# Patient Record
Sex: Female | Born: 1980 | Race: Black or African American | Hispanic: No | Marital: Married | Smoking: Never smoker
Health system: Southern US, Community
[De-identification: ages and names within clinical notes are randomized; demographics above are authoritative.]

## PROBLEM LIST (undated history)

## (undated) ENCOUNTER — Emergency Department (HOSPITAL_COMMUNITY): Admission: EM | Payer: 59

## (undated) DIAGNOSIS — Z973 Presence of spectacles and contact lenses: Secondary | ICD-10-CM

## (undated) DIAGNOSIS — Z9289 Personal history of other medical treatment: Secondary | ICD-10-CM

## (undated) DIAGNOSIS — G378 Other specified demyelinating diseases of central nervous system: Secondary | ICD-10-CM

## (undated) DIAGNOSIS — J302 Other seasonal allergic rhinitis: Secondary | ICD-10-CM

## (undated) DIAGNOSIS — Z01419 Encounter for gynecological examination (general) (routine) without abnormal findings: Secondary | ICD-10-CM

## (undated) DIAGNOSIS — L309 Dermatitis, unspecified: Secondary | ICD-10-CM

## (undated) DIAGNOSIS — Z9884 Bariatric surgery status: Secondary | ICD-10-CM

## (undated) DIAGNOSIS — G3781 Myelin oligodendrocyte glycoprotein antibody disease: Secondary | ICD-10-CM

## (undated) DIAGNOSIS — F329 Major depressive disorder, single episode, unspecified: Secondary | ICD-10-CM

## (undated) HISTORY — DX: Encounter for gynecological examination (general) (routine) without abnormal findings: Z01.419

## (undated) HISTORY — DX: Personal history of other medical treatment: Z92.89

## (undated) HISTORY — DX: Bariatric surgery status: Z98.84

## (undated) HISTORY — DX: Other seasonal allergic rhinitis: J30.2

## (undated) HISTORY — DX: Dermatitis, unspecified: L30.9

## (undated) HISTORY — DX: Major depressive disorder, single episode, unspecified: F32.9

## (undated) HISTORY — DX: Presence of spectacles and contact lenses: Z97.3

---

## 2006-03-15 DIAGNOSIS — Z9289 Personal history of other medical treatment: Secondary | ICD-10-CM

## 2006-03-15 HISTORY — DX: Personal history of other medical treatment: Z92.89

## 2007-02-16 ENCOUNTER — Other Ambulatory Visit: Admission: RE | Admit: 2007-02-16 | Discharge: 2007-02-16 | Payer: Self-pay | Admitting: Obstetrics & Gynecology

## 2008-02-24 ENCOUNTER — Inpatient Hospital Stay (HOSPITAL_COMMUNITY): Admission: AD | Admit: 2008-02-24 | Discharge: 2008-02-25 | Payer: Self-pay | Admitting: Obstetrics and Gynecology

## 2008-02-24 IMAGING — US US OB LIMITED
1 series · 14 of 14 positions shown · non-contrast
Comparison: none

OBSTETRICAL ULTRASOUND:
 This ultrasound exam was performed in the [HOSPITAL] Ultrasound Department.  The OB US report was generated in the AS system, and faxed to the ordering physician.  This report is also available in [REDACTED] PACS.

[Series 1: us ob limited · 0.28mm/px · 14 of 14 slices shown]
[im 1/14]
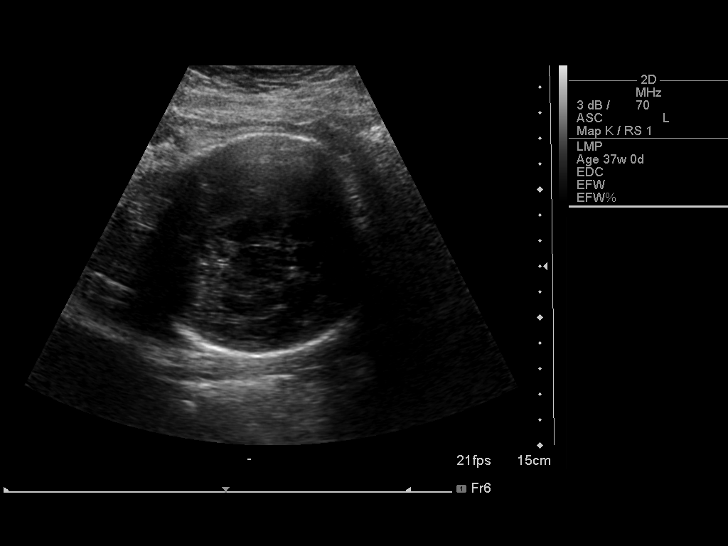
[im 2/14]
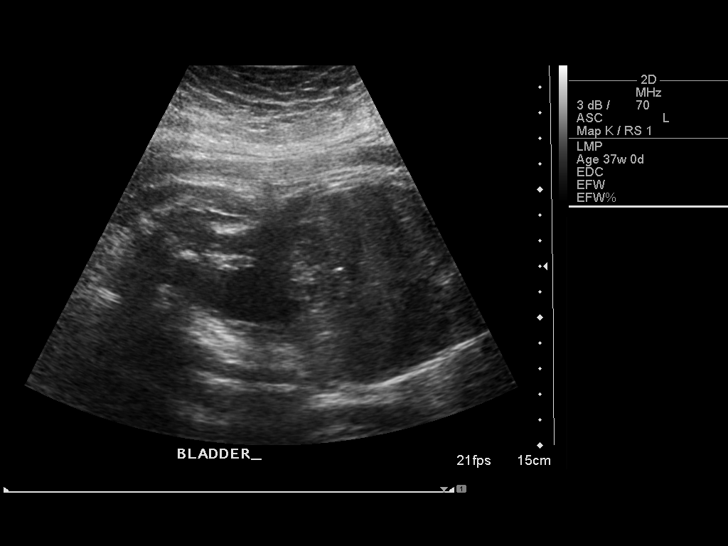
[im 3/14]
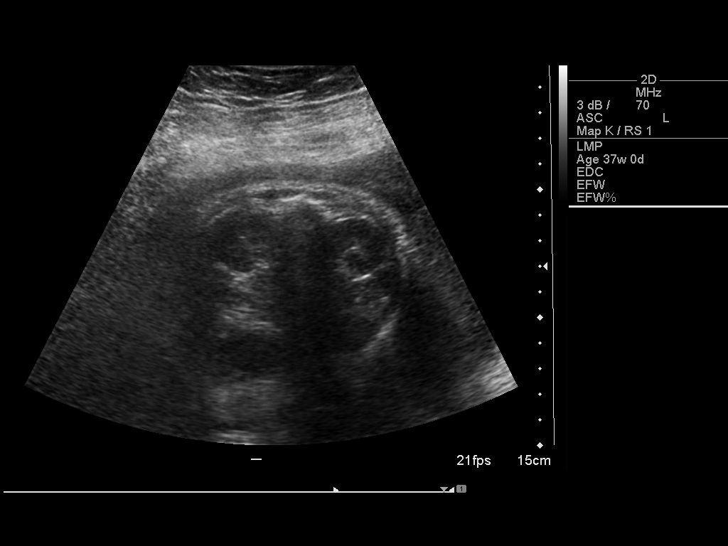
[im 4/14]
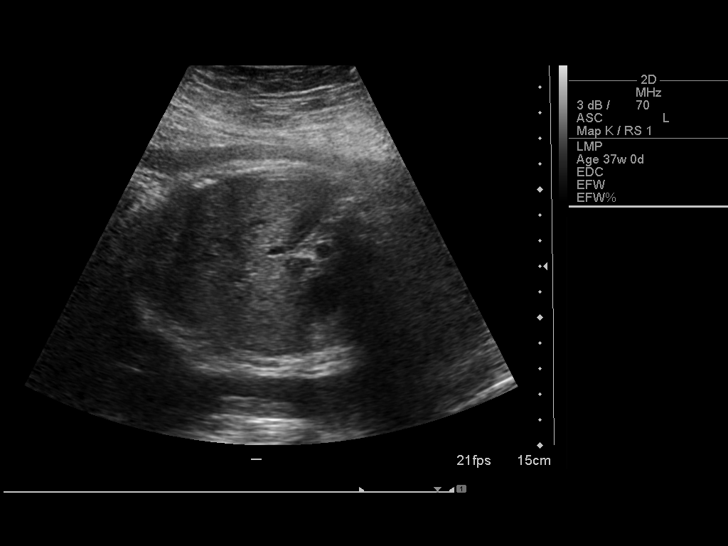
[im 5/14]
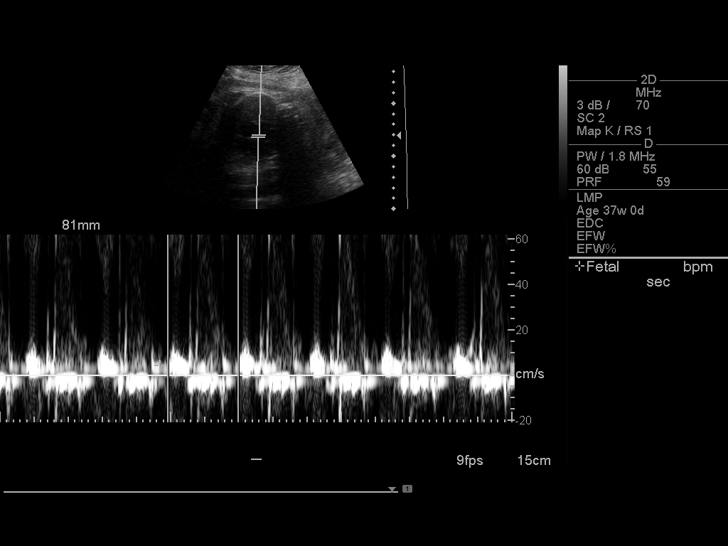
[im 6/14]
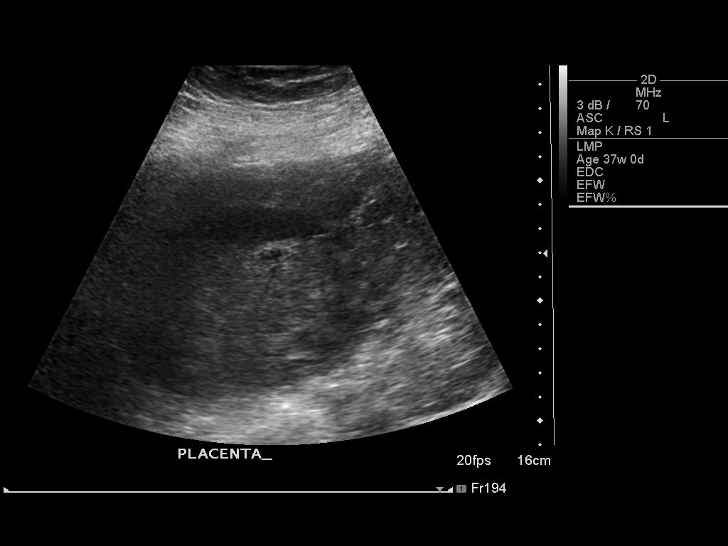
[im 7/14]
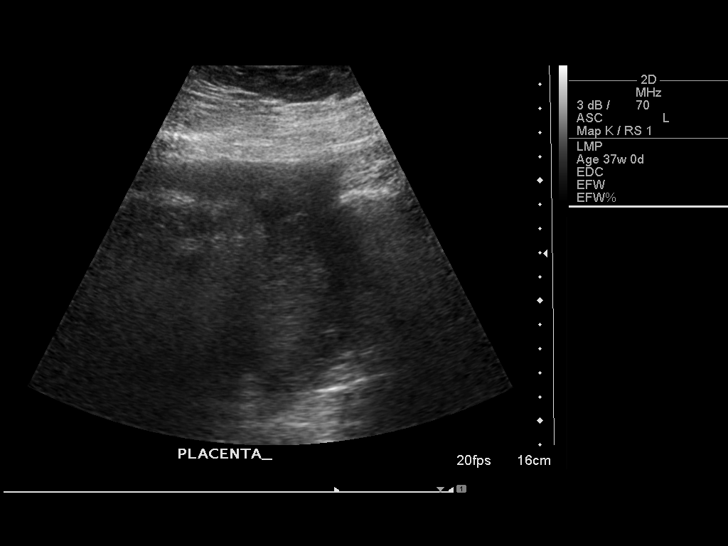
[im 8/14]
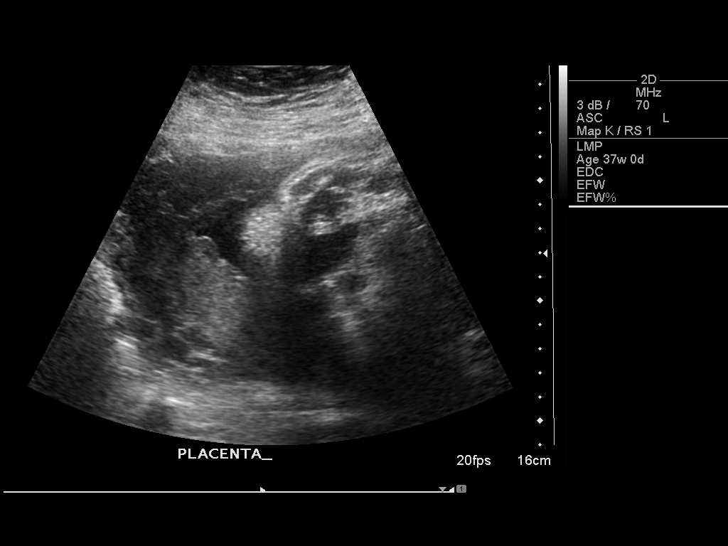
[im 9/14]
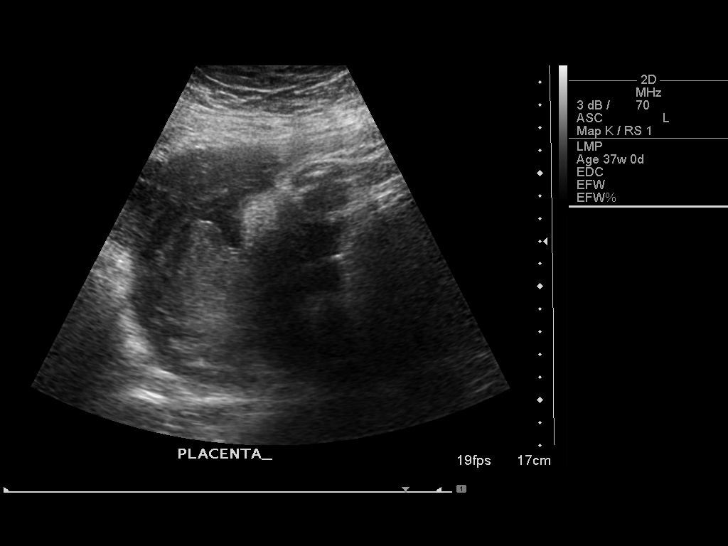
[im 10/14]
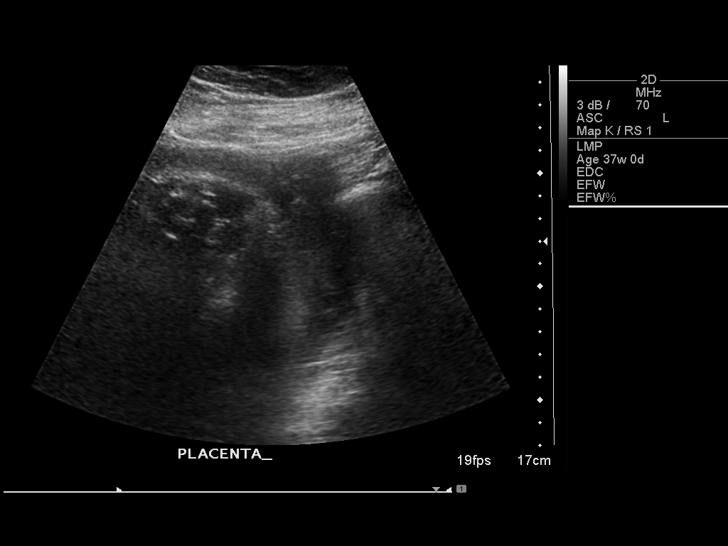
[im 11/14]
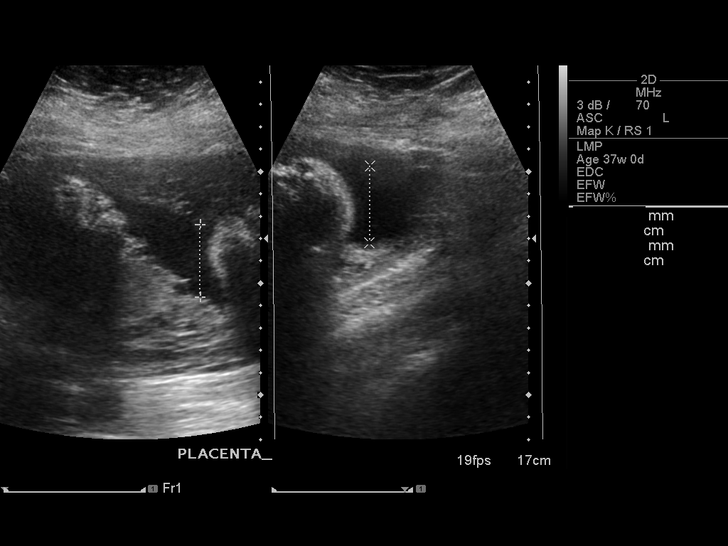
[im 12/14]
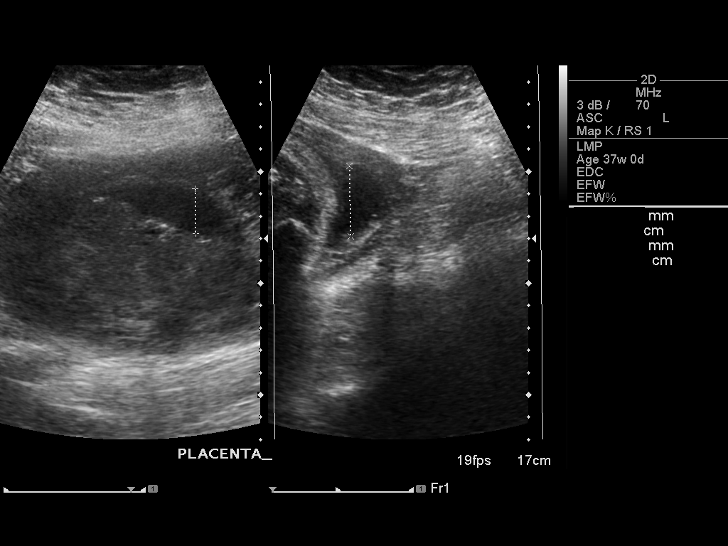
[im 13/14]
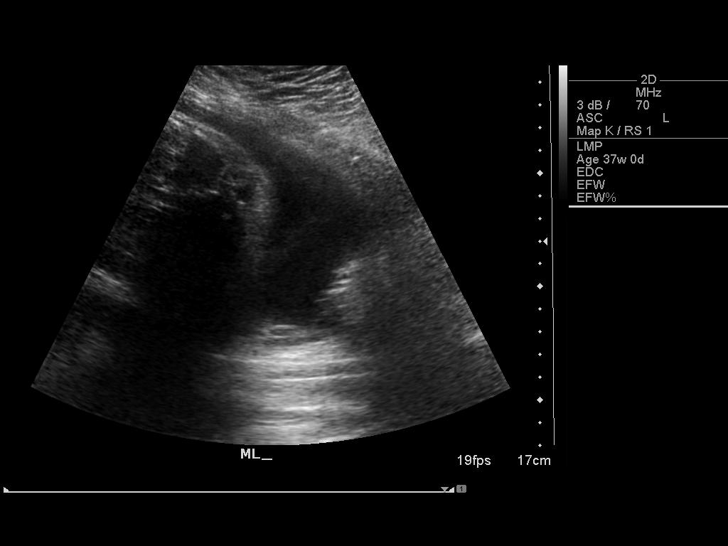
[im 14/14]
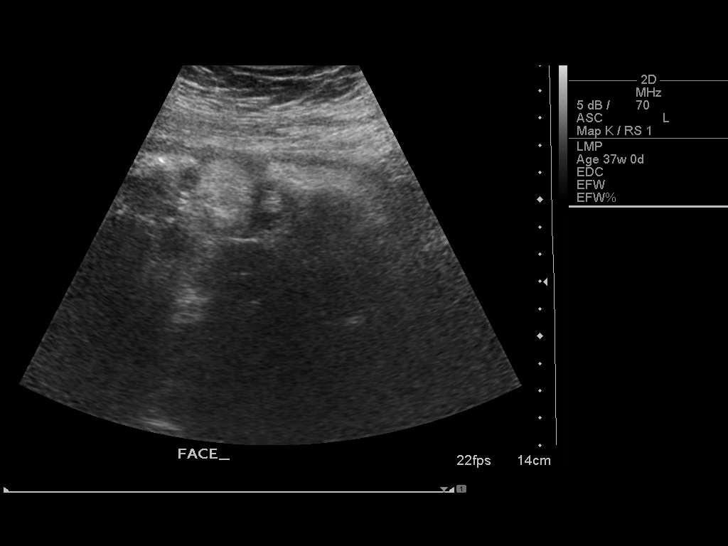

[14 of 14 positions shown; findings below may reference images not displayed]

IMPRESSION: See AS Obstetric US report.

## 2008-02-26 ENCOUNTER — Inpatient Hospital Stay (HOSPITAL_COMMUNITY): Admission: AD | Admit: 2008-02-26 | Discharge: 2008-02-29 | Payer: Self-pay | Admitting: Obstetrics and Gynecology

## 2009-02-20 ENCOUNTER — Ambulatory Visit (HOSPITAL_COMMUNITY): Admission: RE | Admit: 2009-02-20 | Discharge: 2009-02-20 | Payer: Self-pay | Admitting: General Surgery

## 2009-02-20 IMAGING — CR DG CHEST 2V
2 series · 2 of 2 positions shown · non-contrast
Comparison: None

CLINICAL DATA: Bariatric screening evaluation.  Nonsmoker.  No
current chest complaints.

CHEST - 2 VIEW

[view not recorded (1 of 2)]
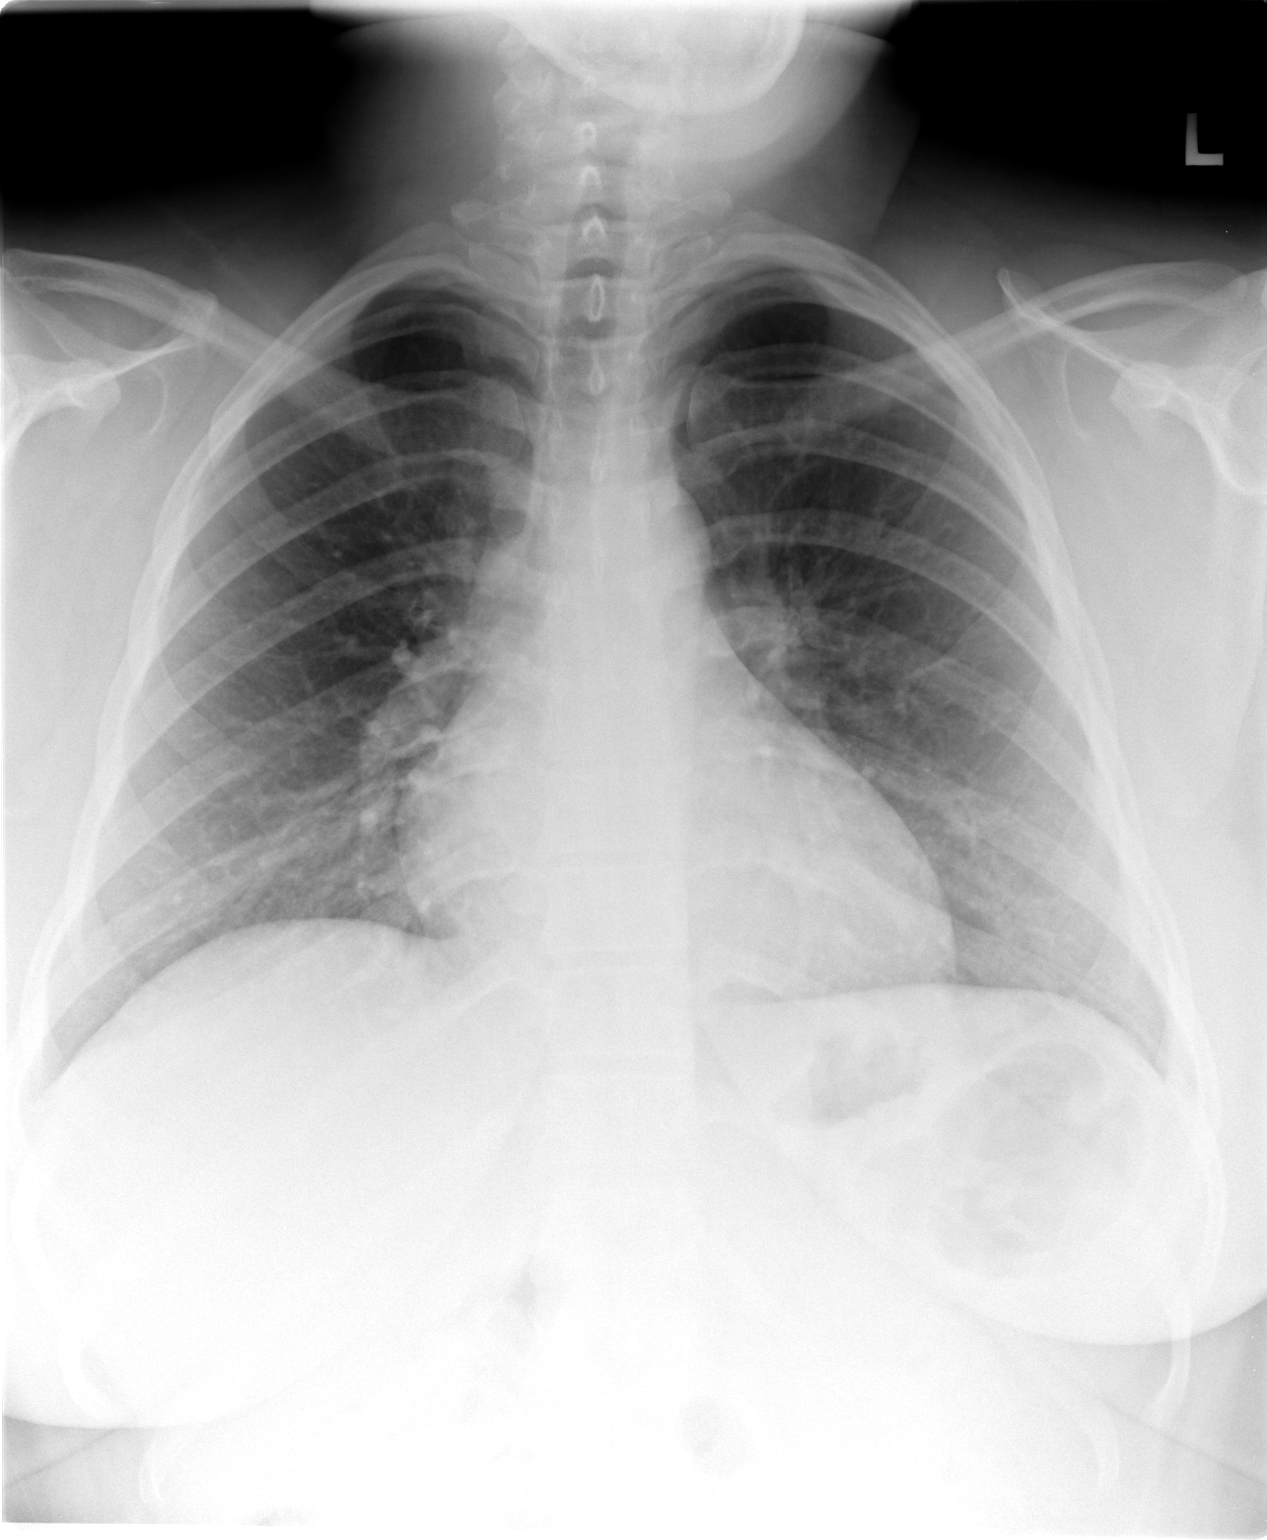

[view not recorded (2 of 2)]
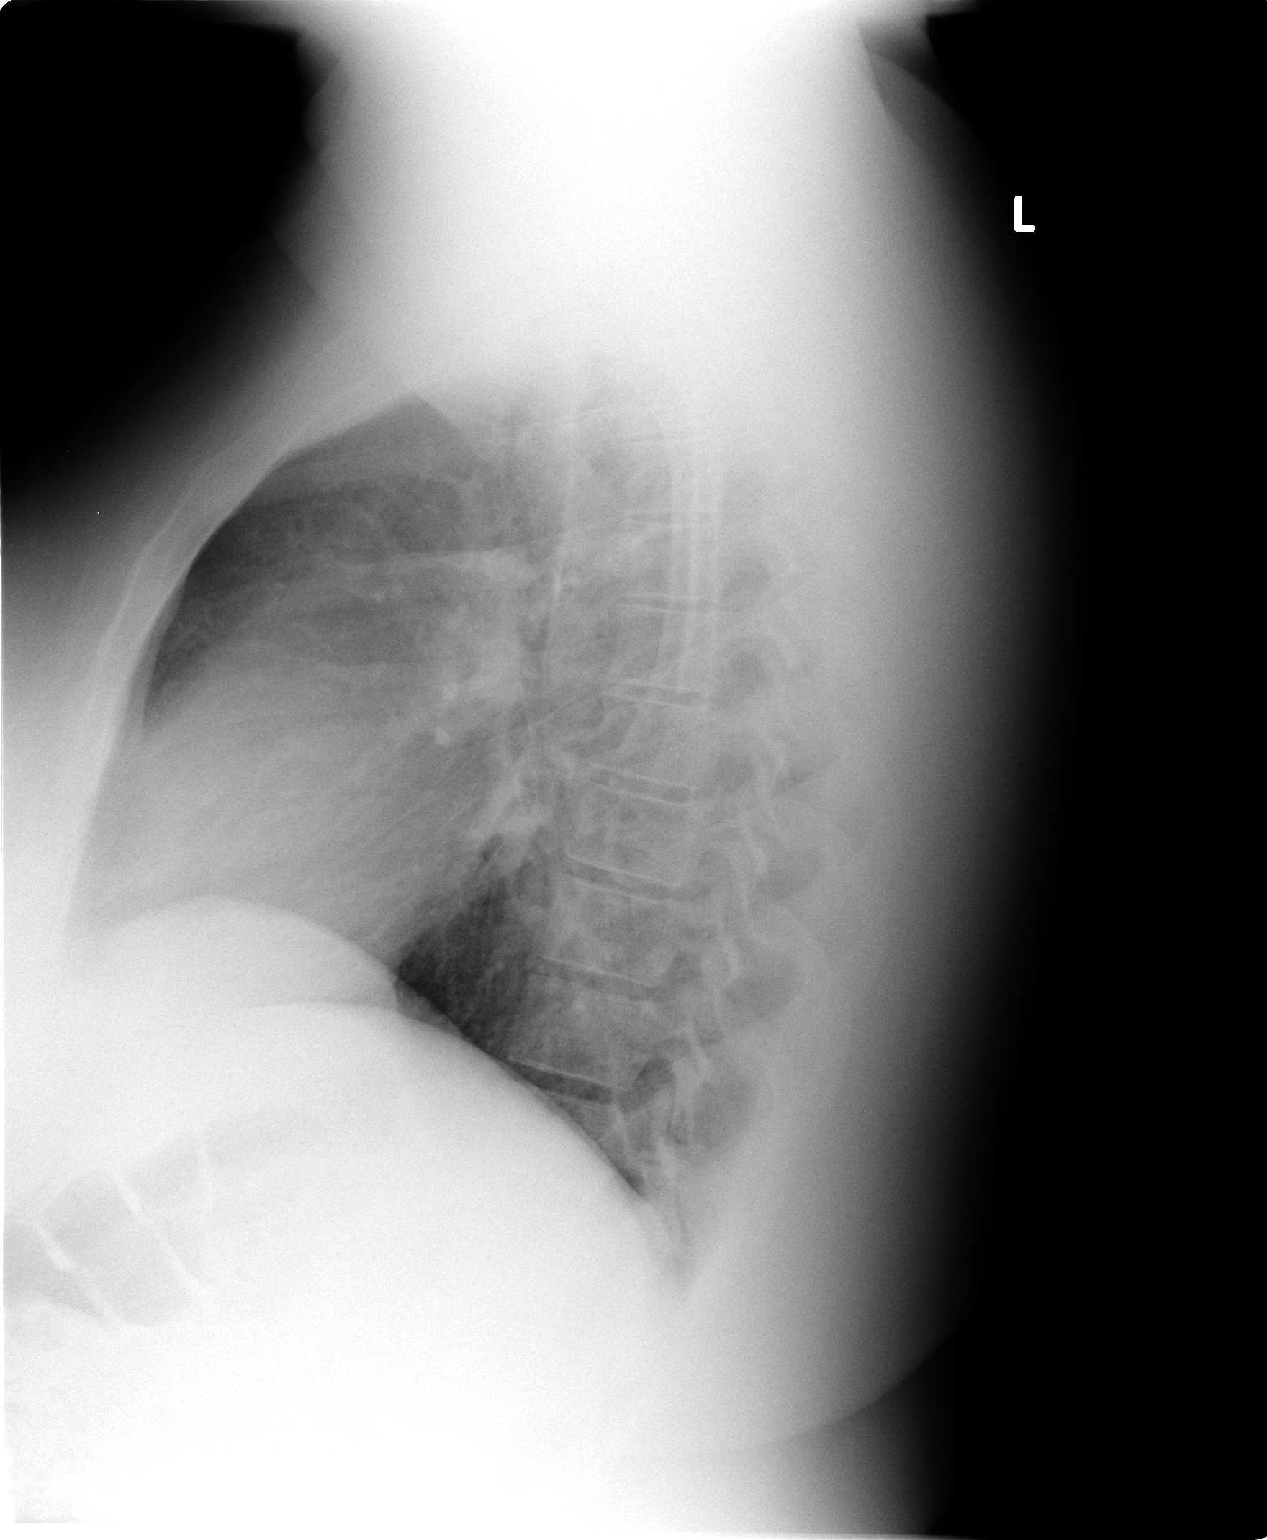

[2 of 2 positions shown; findings below may reference images not displayed]

FINDINGS: Heart and mediastinal contours are within normal limits.
The lung fields are clear with no signs of focal infiltrate or
congestive failure.  No pleural effusions or peribronchial cuffing
are seen.  Bony structures are intact.
IMPRESSION: No acute or focal cardiopulmonary abnormality suggested.

## 2009-02-20 IMAGING — CR DG UGI W/ KUB
7 series · 7 of 7 positions shown · non-contrast
Comparison: None

CLINICAL DATA: Bariatric screening evaluation.  No gastric
complaints

UPPER GI SERIES WITH KUB
TECHNIQUE: Routine upper GI series was performed with thin barium.
Fluoroscopy Time: 2.0 minutes

[view not recorded (1 of 7)]
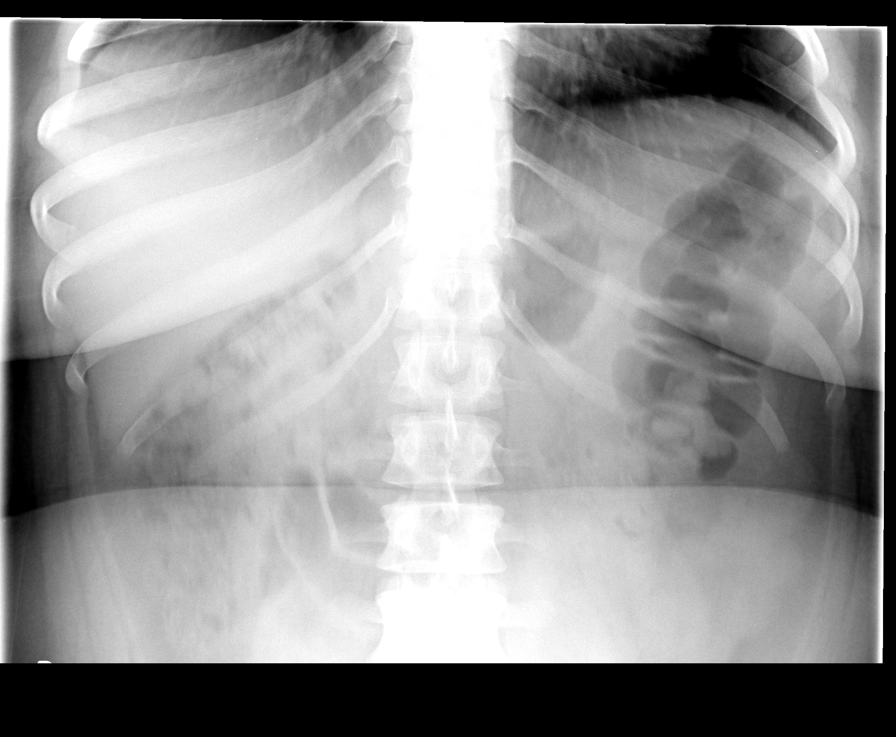

[view not recorded (2 of 7)]
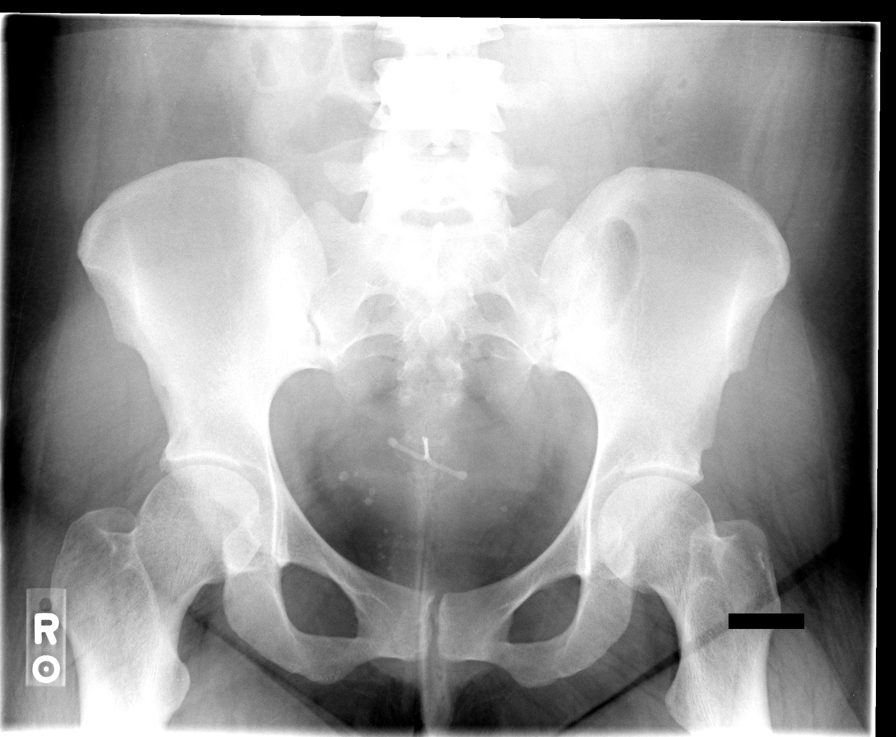

[view not recorded (3 of 7)]
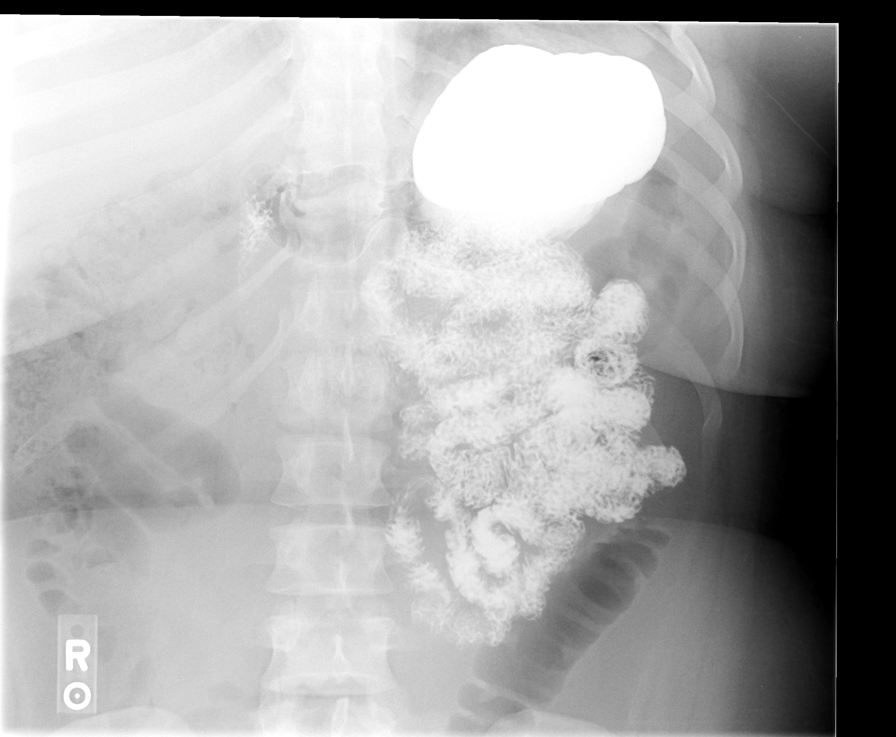

[view not recorded (4 of 7)]
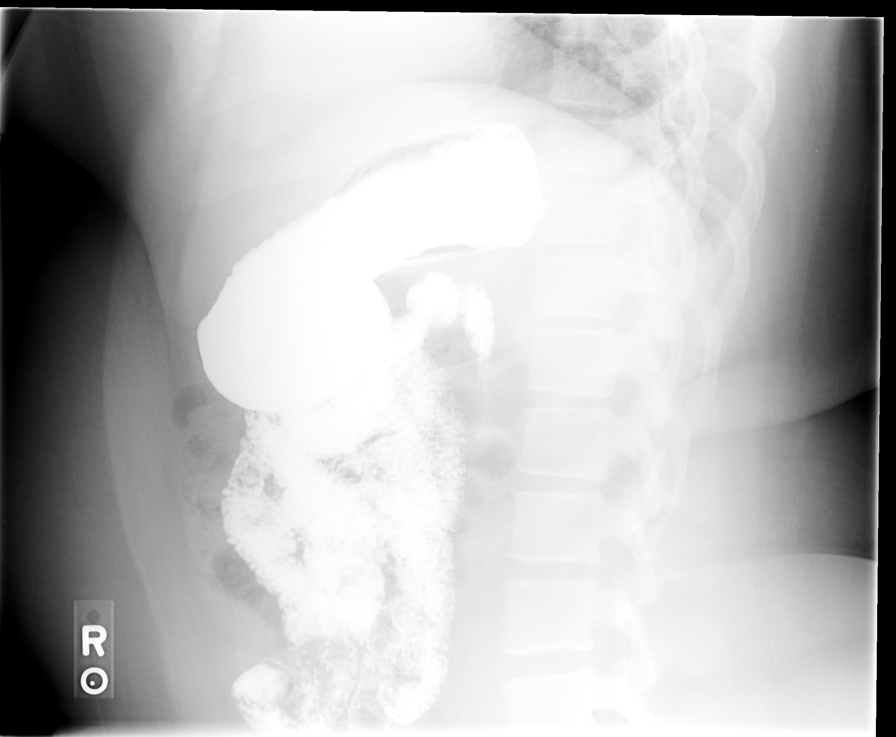

[view not recorded (5 of 7)]
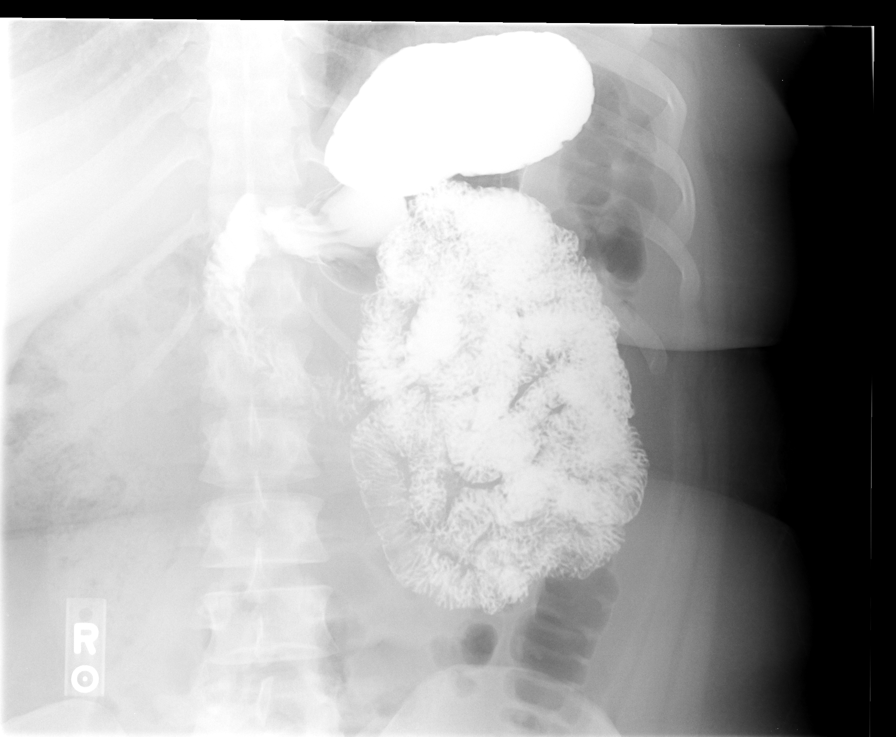

[view not recorded (6 of 7)]
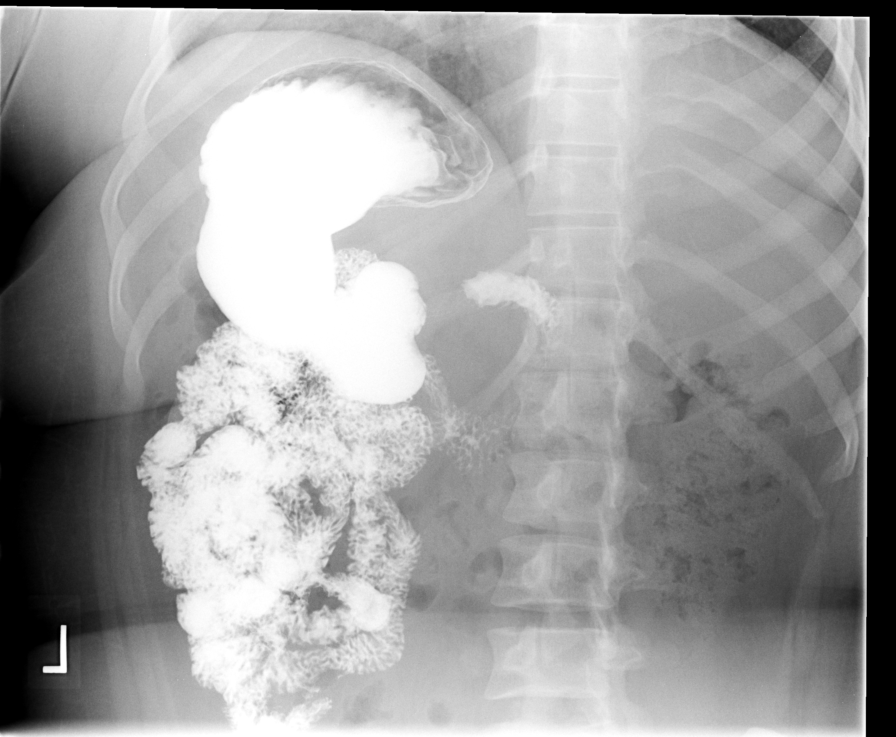

[view not recorded (7 of 7)]
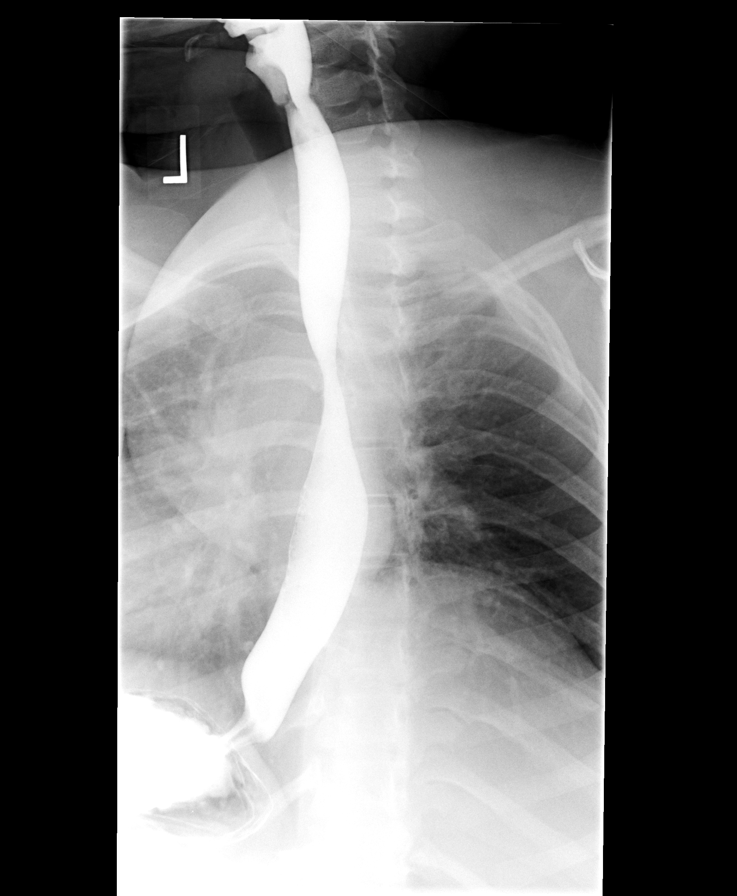

[7 of 7 positions shown; findings below may reference images not displayed]

FINDINGS: KUB:  A nonobstructive bowel gas pattern is seen.  No
abnormal calcifications or focal bony abnormalities are seen.  The
patient's IUD projects over the midline of the pelvis.

Upper GI:  The patient was able to swallow barium without
difficulty.  Esophageal mucosa and motility appear within normal
limits.  No evidence for hiatal hernia or gastroesophageal reflux
is noted.  Gastric mucosa and motility appear within normal limits.
The duodenal bulb was well distended and has a normal appearance.
The visualized small bowel to the level of the mid jejunum is
unremarkable.
IMPRESSION: Normal upper GI.

## 2009-02-20 IMAGING — RF DG UGI W/ KUB
13 series · 13 of 13 positions shown · non-contrast
Comparison: None

CLINICAL DATA: Bariatric screening evaluation.  No gastric
complaints

UPPER GI SERIES WITH KUB
TECHNIQUE: Routine upper GI series was performed with thin barium.
Fluoroscopy Time: 2.0 minutes

[Series 1: run · 1 of 1 slices shown (1 of 13)]
[im 1/1]
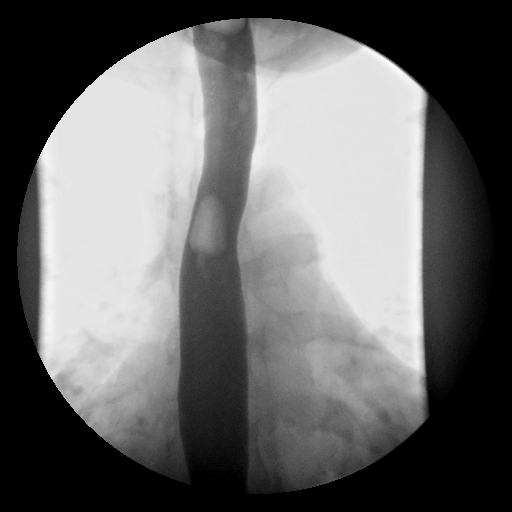

[Series 2: run · 1 of 1 slices shown (2 of 13)]
[im 1/1]
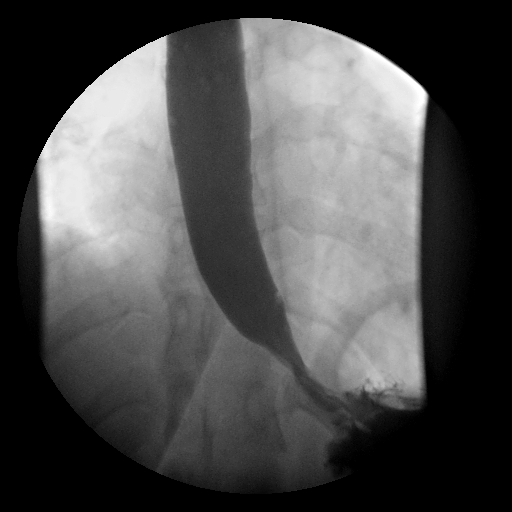

[Series 3: run · 1 of 1 slices shown (3 of 13)]
[im 1/1]
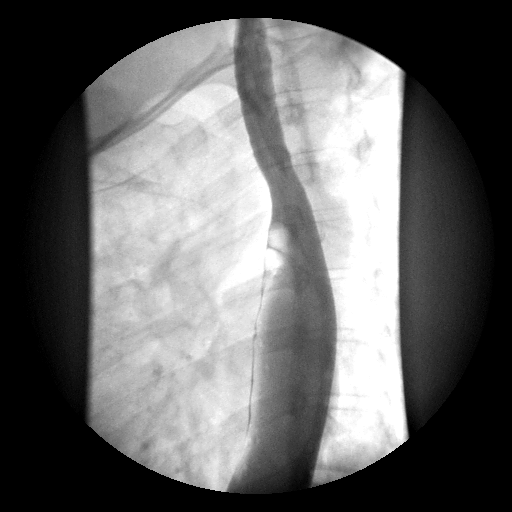

[Series 4: run · 1 of 1 slices shown (4 of 13)]
[im 1/1]
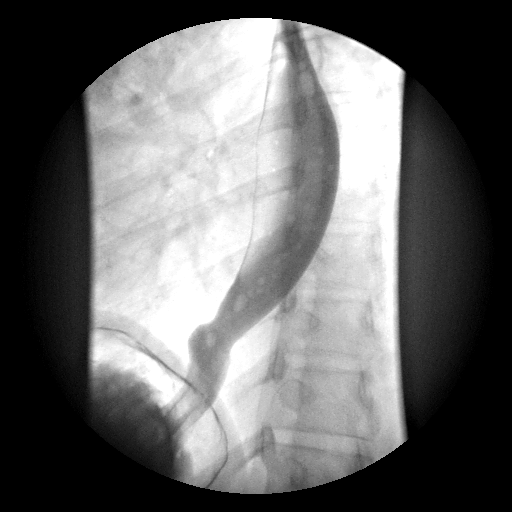

[Series 5: run · 1 of 1 slices shown (5 of 13)]
[im 1/1]
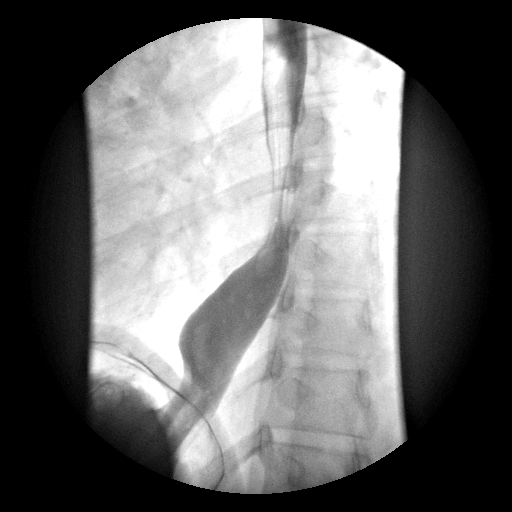

[Series 6: run · 1 of 1 slices shown (6 of 13)]
[im 1/1]
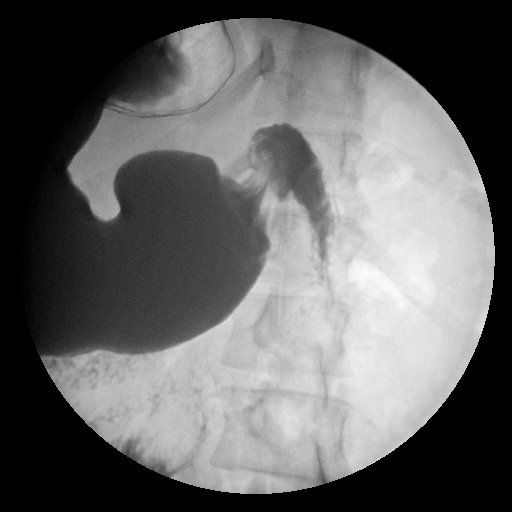

[Series 7: run · 1 of 1 slices shown (7 of 13)]
[im 1/1]
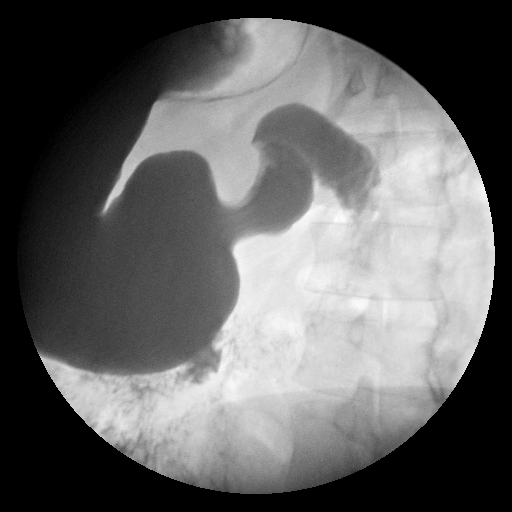

[Series 8: run · 1 of 1 slices shown (8 of 13)]
[im 1/1]
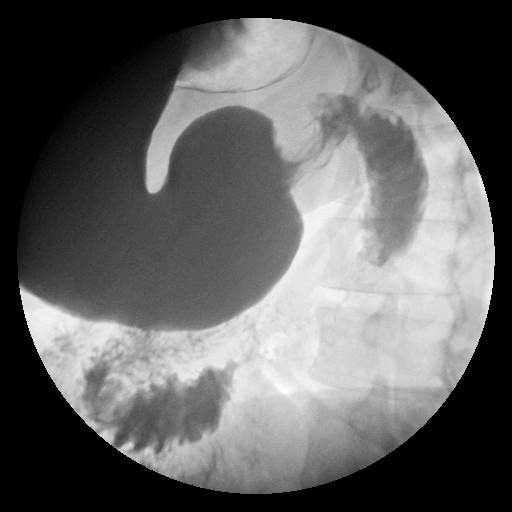

[Series 9: run · 1 of 1 slices shown (9 of 13)]
[im 1/1]
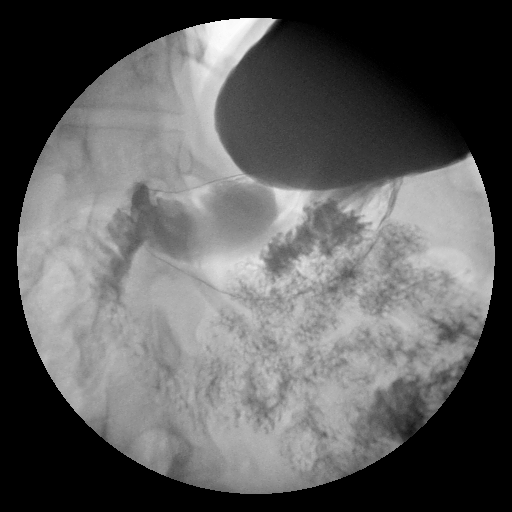

[Series 10: run · 1 of 1 slices shown (10 of 13)]
[im 1/1]
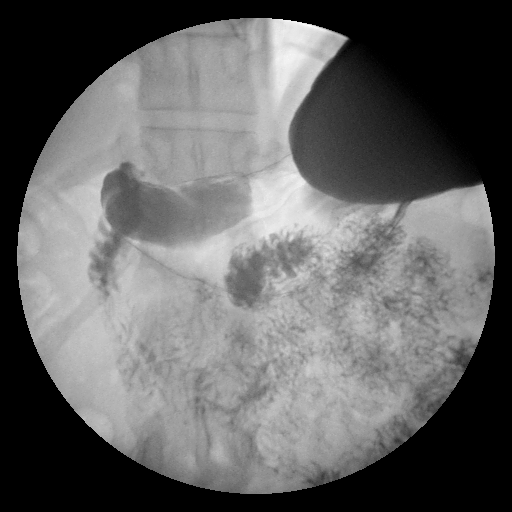

[Series 11: run · 1 of 1 slices shown (11 of 13)]
[im 1/1]
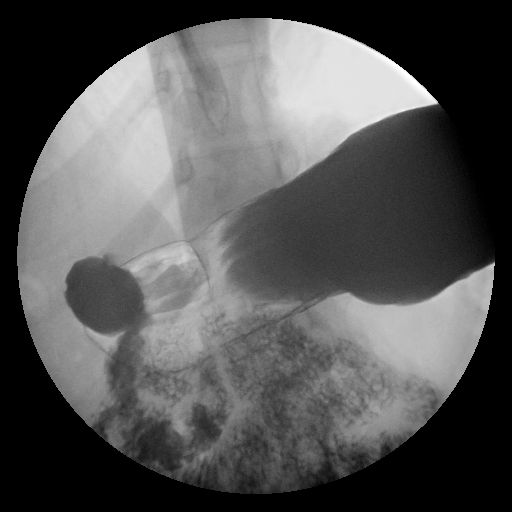

[Series 12: run · 1 of 1 slices shown (12 of 13)]
[im 1/1]
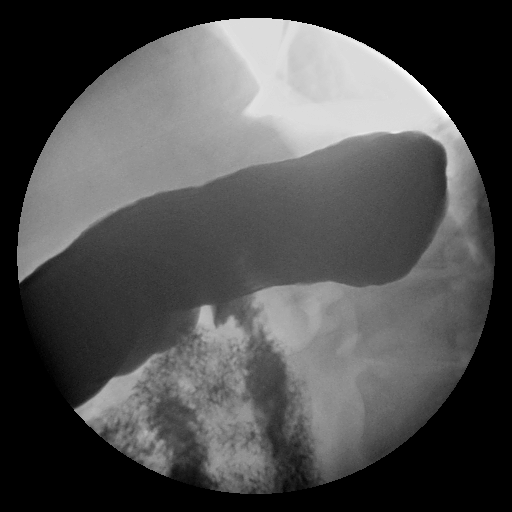

[Series 13: run · 1 of 1 slices shown (13 of 13)]
[im 1/1]
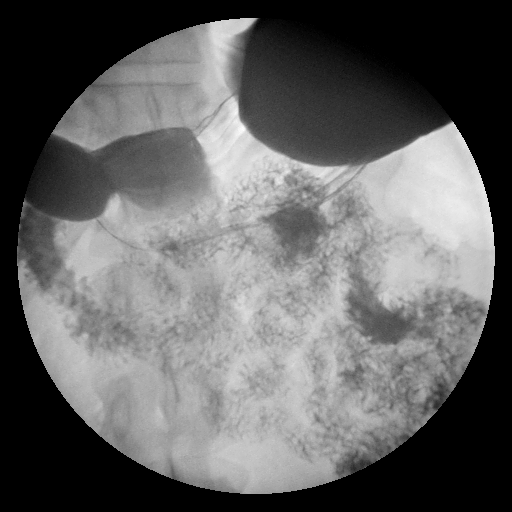

[13 of 13 positions shown; findings below may reference images not displayed]

FINDINGS: KUB:  A nonobstructive bowel gas pattern is seen.  No
abnormal calcifications or focal bony abnormalities are seen.  The
patient's IUD projects over the midline of the pelvis.

Upper GI:  The patient was able to swallow barium without
difficulty.  Esophageal mucosa and motility appear within normal
limits.  No evidence for hiatal hernia or gastroesophageal reflux
is noted.  Gastric mucosa and motility appear within normal limits.
The duodenal bulb was well distended and has a normal appearance.
The visualized small bowel to the level of the mid jejunum is
unremarkable.
IMPRESSION: Normal upper GI.

## 2009-03-15 DIAGNOSIS — F32A Depression, unspecified: Secondary | ICD-10-CM

## 2009-03-15 HISTORY — DX: Depression, unspecified: F32.A

## 2009-03-18 ENCOUNTER — Encounter: Admission: RE | Admit: 2009-03-18 | Discharge: 2009-06-16 | Payer: Self-pay | Admitting: General Surgery

## 2009-05-19 ENCOUNTER — Ambulatory Visit (HOSPITAL_COMMUNITY): Admission: RE | Admit: 2009-05-19 | Discharge: 2009-05-19 | Payer: Self-pay | Admitting: General Surgery

## 2009-05-19 HISTORY — PX: LAPAROSCOPIC GASTRIC BANDING: SHX1100

## 2009-05-19 IMAGING — CR DG ABDOMEN 1V
2 series · 2 of 2 positions shown · non-contrast
Comparison: [DATE]

CLINICAL DATA: More obesity, Laproscopic gastric banding

ABDOMEN - 1 VIEW

[t abdomen supine (1 of 2)]
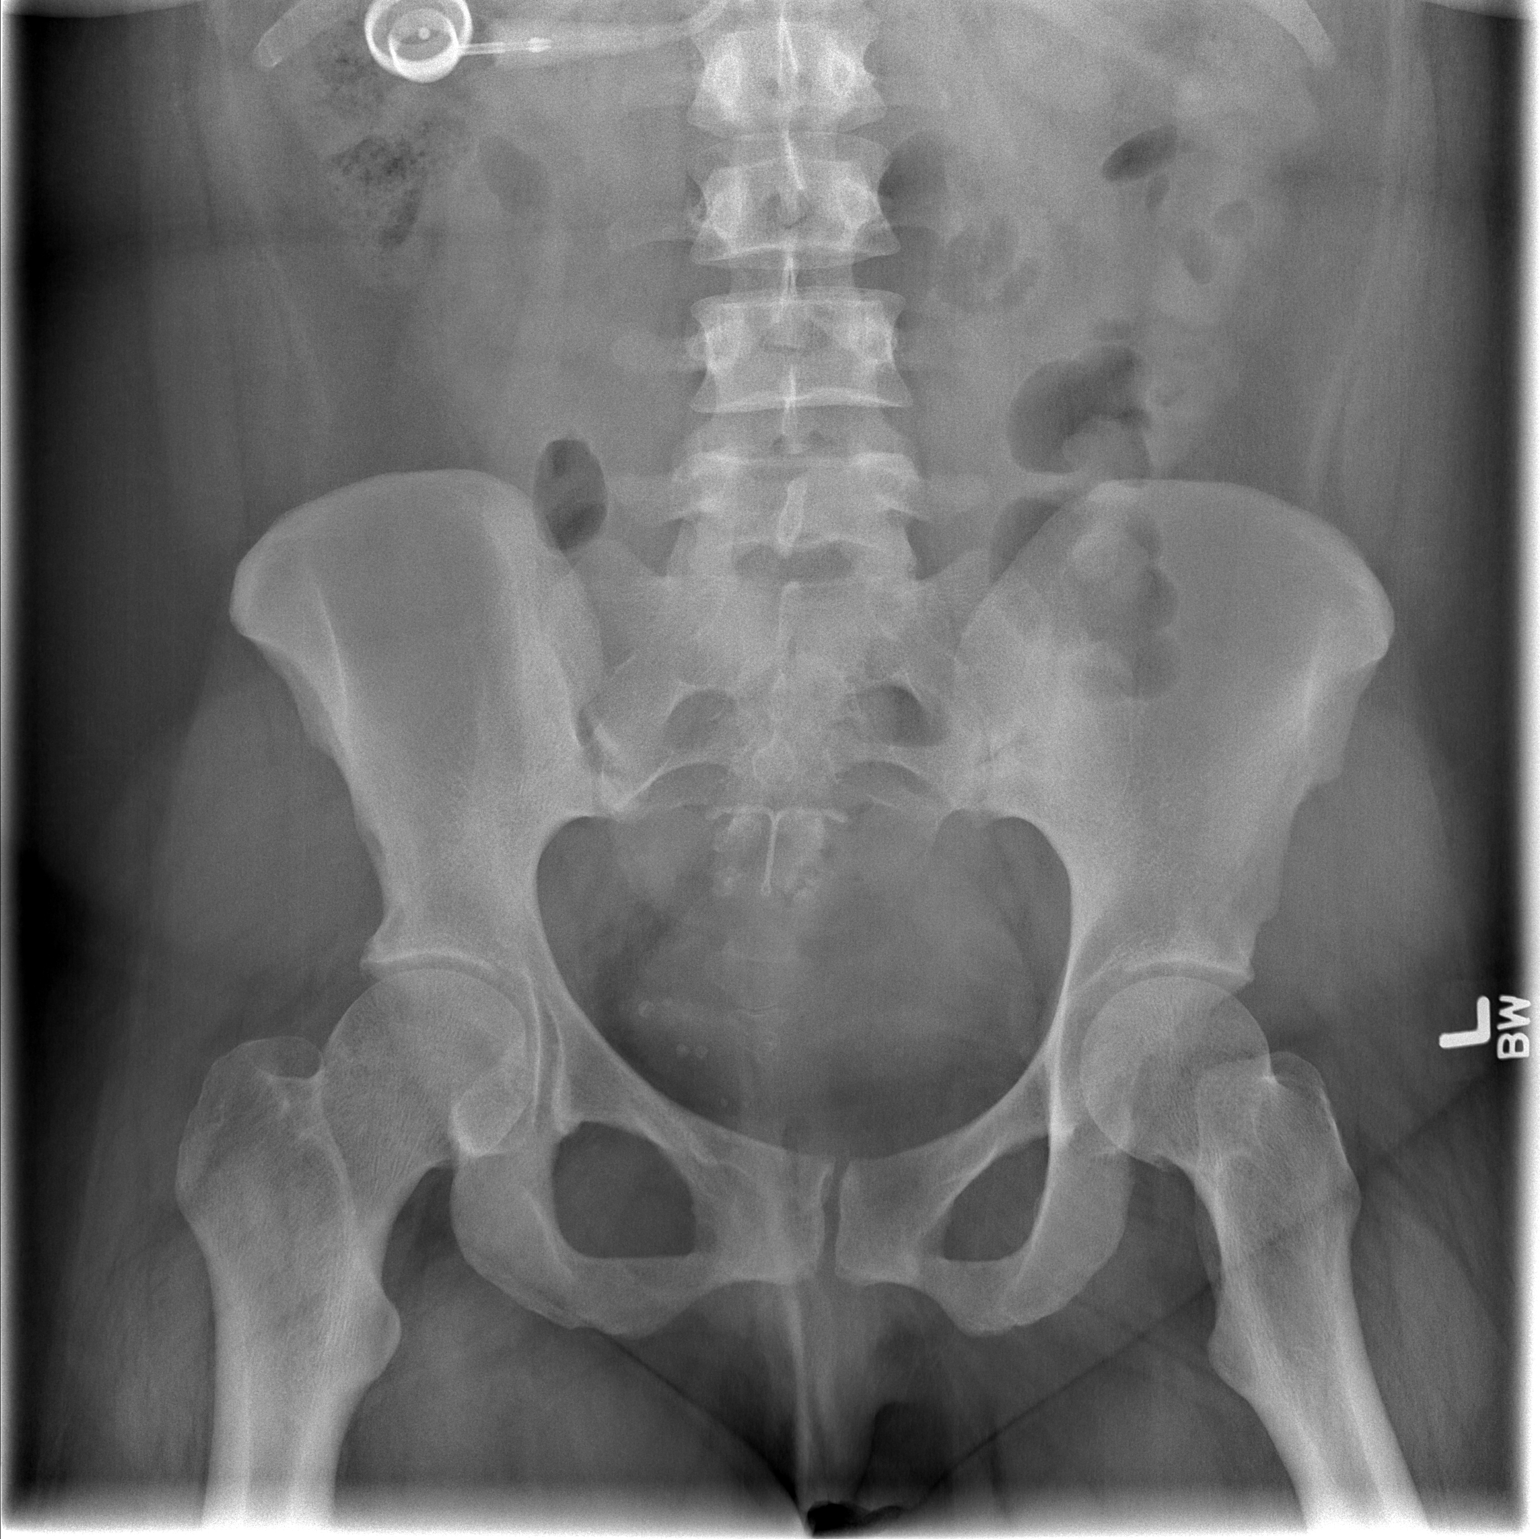

[t abdomen supine (2 of 2)]
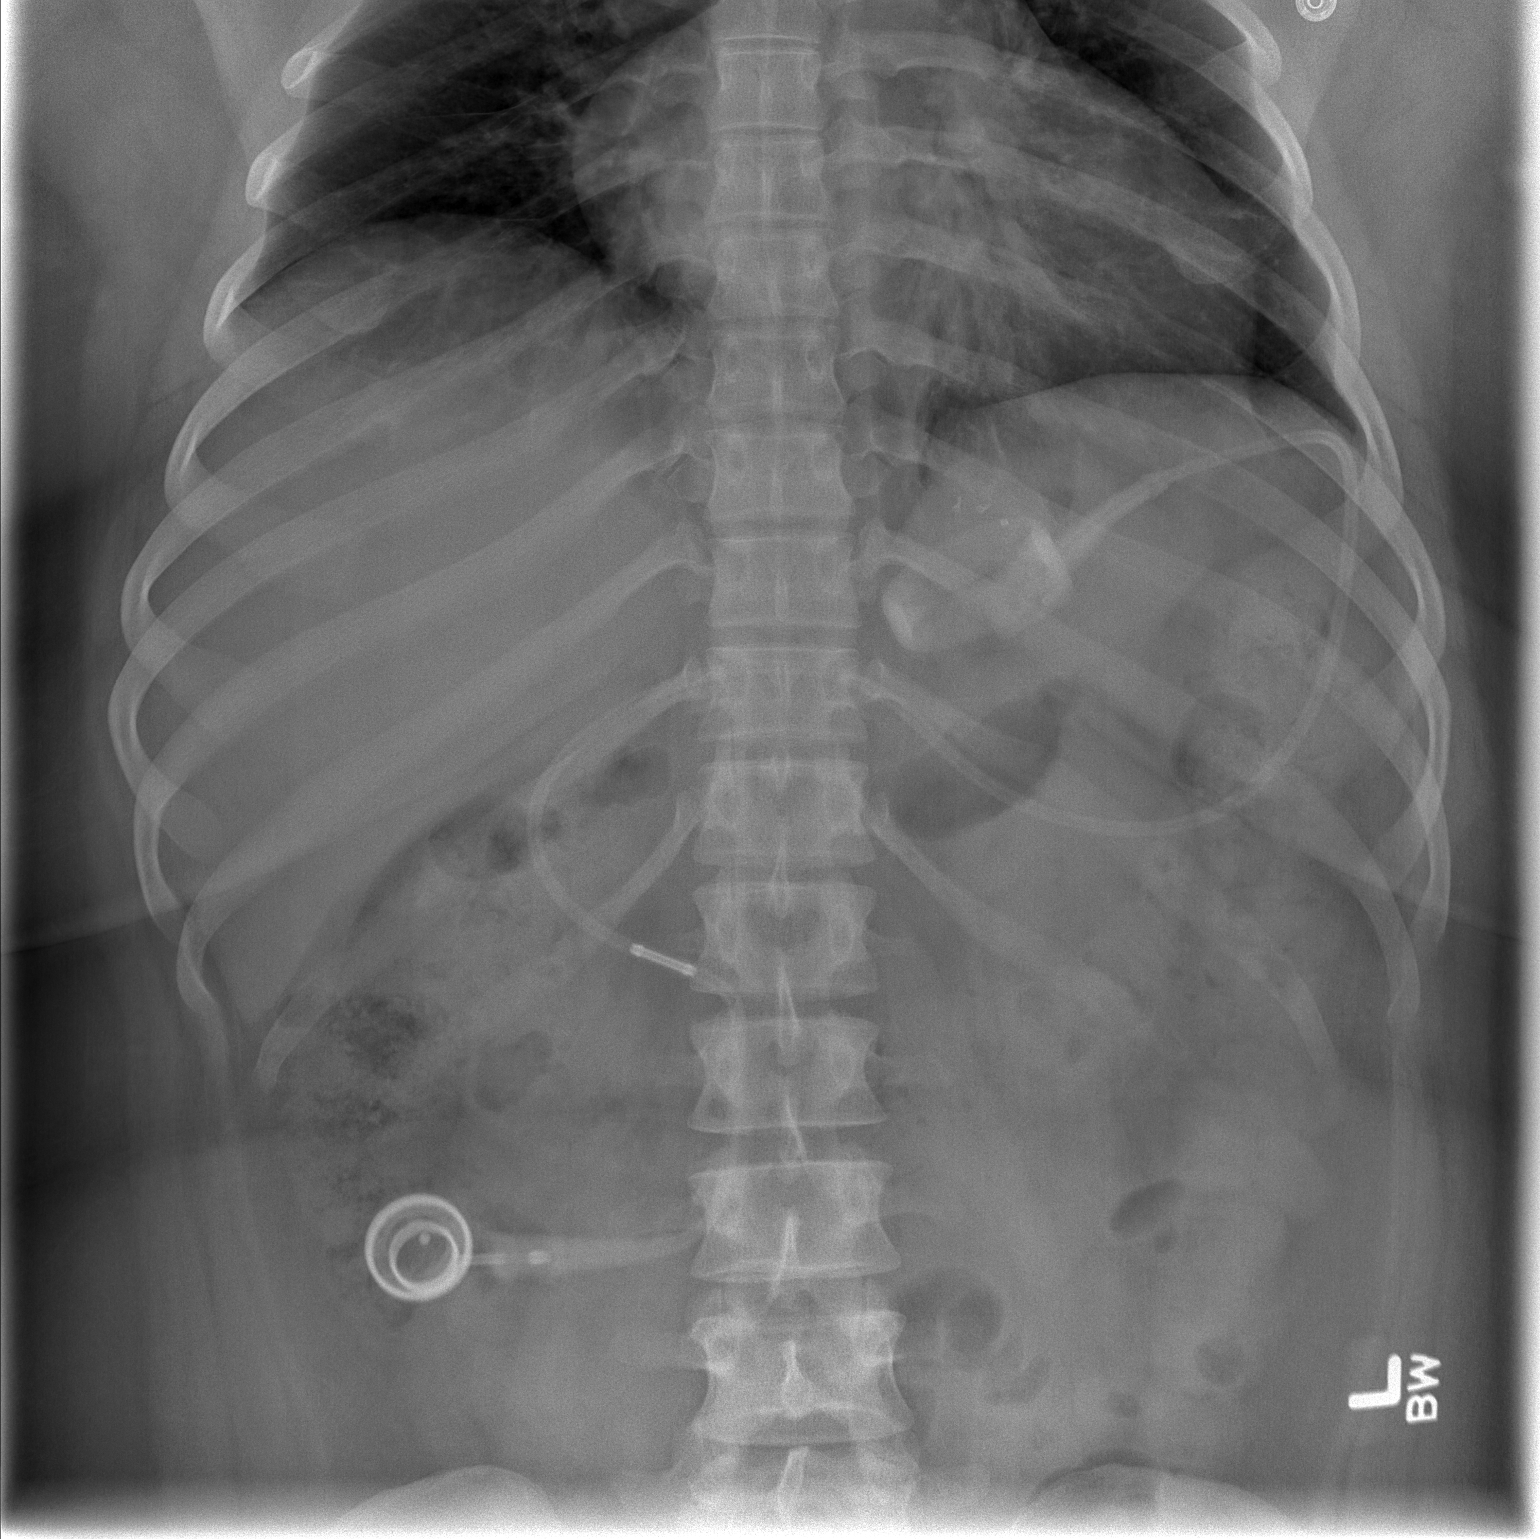

[2 of 2 positions shown; findings below may reference images not displayed]

FINDINGS: The patient is status post laparoscopic gastric banding.
Gastric band at the GE junction is obliquely oriented along the 2
o'clock to 8 o'clock plane.  Tubing extends to the reservoir in the
right abdomen.  Minimal basilar atelectasis.  Normal bowel gas
pattern.
IMPRESSION: Status post Laproscopic gastric banding.  Expected postoperative
position.

## 2009-07-24 ENCOUNTER — Encounter: Admission: RE | Admit: 2009-07-24 | Discharge: 2009-07-24 | Payer: Self-pay | Admitting: General Surgery

## 2009-09-29 ENCOUNTER — Encounter: Admission: RE | Admit: 2009-09-29 | Discharge: 2009-11-13 | Payer: Self-pay | Admitting: General Surgery

## 2010-02-17 ENCOUNTER — Encounter
Admission: RE | Admit: 2010-02-17 | Discharge: 2010-04-14 | Payer: Self-pay | Source: Home / Self Care | Attending: General Surgery | Admitting: General Surgery

## 2010-05-19 ENCOUNTER — Encounter: Admit: 2010-05-19 | Payer: Self-pay | Admitting: General Surgery

## 2010-05-19 ENCOUNTER — Encounter: Payer: 59 | Attending: General Surgery | Admitting: *Deleted

## 2010-05-19 DIAGNOSIS — Z713 Dietary counseling and surveillance: Secondary | ICD-10-CM | POA: Insufficient documentation

## 2010-05-19 DIAGNOSIS — Z09 Encounter for follow-up examination after completed treatment for conditions other than malignant neoplasm: Secondary | ICD-10-CM | POA: Insufficient documentation

## 2010-05-19 DIAGNOSIS — Z9884 Bariatric surgery status: Secondary | ICD-10-CM | POA: Insufficient documentation

## 2010-06-08 LAB — COMPREHENSIVE METABOLIC PANEL
ALT: 16 U/L (ref 0–35)
AST: 17 U/L (ref 0–37)
Albumin: 4.1 g/dL (ref 3.5–5.2)
Alkaline Phosphatase: 43 U/L (ref 39–117)
BUN: 14 mg/dL (ref 6–23)
CO2: 28 mEq/L (ref 19–32)
Calcium: 9.4 mg/dL (ref 8.4–10.5)
Chloride: 106 mEq/L (ref 96–112)

## 2010-06-08 LAB — DIFFERENTIAL
Basophils Absolute: 0 10*3/uL (ref 0.0–0.1)
Lymphocytes Relative: 31 % (ref 12–46)
Lymphs Abs: 1.6 10*3/uL (ref 0.7–4.0)
Monocytes Relative: 8 % (ref 3–12)

## 2010-06-08 LAB — CBC
HCT: 38.9 % (ref 36.0–46.0)
Hemoglobin: 13.2 g/dL (ref 12.0–15.0)
MCV: 88 fL (ref 78.0–100.0)
Platelets: 306 10*3/uL (ref 150–400)
RBC: 4.41 MIL/uL (ref 3.87–5.11)
RDW: 13.1 % (ref 11.5–15.5)

## 2010-06-08 LAB — PREGNANCY, URINE: Preg Test, Ur: NEGATIVE

## 2010-07-28 NOTE — Discharge Summary (Signed)
[name], [name]               ACCOUNT NO.:  [id number]   MEDICAL RECORD NO.:  [id number]          PATIENT TYPE:  INP   LOCATION:  9105                          FACILITY:  [facility]   PHYSICIAN:  Sherron Monday, MD        DATE OF BIRTH:  [birth year]   DATE OF ADMISSION:  02/26/2008  DATE OF DISCHARGE:  02/29/2008                               DISCHARGE SUMMARY   ADMITTING DIAGNOSES:  Intrauterine pregnancy at term, history of low-  transverse cesarean section, desires repeat, laboring with cervical  change.   DISCHARGE DIAGNOSES:  Intrauterine pregnancy at term,  history of low-  transverse cesarean section, desires repeat, laboring with cervical  change, delivered.   PROCEDURE:  Repeat low-transverse cesarean section.   HISTORY OF PRESENT ILLNESS:  A 30 year old G2, P1-0-8-1 at 37 weeks with  contractions she says that is increasing in intensity and frequency.  She is having good fetal movement, no loss of fluid, and no vaginal  bleeding.  She has been untreated for a positive UTI, this will be  treated.  She was given antibiotics, but has not yet taken them.  She  has had uncomplicated prenatal care except early dilatations with some  contractions with a positive fetal heart sounds.  She has had cervical  change from her initial check at the office to  proceed with cesarean  section after discussing with benefits and alternatives of the repeat.  The patient voiced understanding and wished to proceed.   PAST MEDICAL HISTORY:  Nonsignificant.   PAST SURGICAL HISTORY:  Significant for a low-transverse cesarean  section as well as multiple D & Cs.   PAST OB/GYN HISTORY:  G1 through G5 were therapeutic abortions, G6  determined for transverse cesarean section, G7 through G9  was a  miscarriage.  G10 is the present pregnancy.  She has had abnormal Pap  smears and normal polyps.  No history of any sexually transmitted  diseases.   MEDICATIONS:  Prenatal vitamins.   ALLERGIES:  No  known drug allergies.   SOCIAL HISTORY:  She denies alcohol, tobacco, or drug use, and she is  single.  She does have a history of Trichomonas.   FAMILY HISTORY:  Significant for breast cancer in mother and maternal  grandmother with diabetes.   PRENATAL LABS:  Hemoglobin 12.4 and platelets 276,000.  RPR nonreactive.  Hepatitis C surface antigen negative.  Rubella immune.  HIV negative.  B  positive. Antibody screen negative.  Gonorrhea negative.  Chlamydia  negative.  Cystic fibrosis screen negative.  Her first trimester screen  within limits.  AFB was within normal limits.  Glucola was 111.   PHYSICAL EXAMINATION:  VITAL SIGNS:  Afebrile.  Vital signs are stable  at the time of admission.  GENERAL:  No apparent distress.  CARDIOVASCULAR:  Regular rate and rhythm.  LUNGS:  Clear to auscultation bilaterally.  ABDOMEN:  Soft, nontender.  EXTREMITIES:  Symmetric and nontender.   Fetal heart tones are in the 130s and reactive with irregular  contractions.   Exam in the office revealed 3-4 cm dilated, 50% effaced, and -  2 station.  Exam in the MAU was 4+, 80 and -1 with bulging bag of water.  Ultrasound  performed as prenatal care.  She had normal nuchal thickness on first  trimester screen.  On October 24, 2007 revealed limited heart anatomy  except with an EIF limited base and posterior placenta.  Follow up  November 24, 2007, on 23-6/7 weeks revealed normal anatomy, posterior  placenta, and female infant.  She was admitted and underwent low-  transverse the first cesarean section without complication and delivered  a viable female infant with Apgars of 8 and 9 and a weight of 5 pounds  and 13 ounces.  Postpartum course was relatively uncomplicated.  She  remained afebrile with stable vital signs.  Her hemoglobin decreased  from 12.3 to 10.7.  She was discharged to home on postoperative day #3  having tolerating diet, ambulating well with pain well-controlled and  normal lochia.   She was discharged to home with routine discharge  instructions.  She is to call us if any questions or problems and  prescriptions for Motrin, Vicodin, and prenatal vitamins.  She reports  understanding all this and she will follow up on Monday for a staple  removal.      Sherron Monday, MD  Electronically Signed     JB/MEDQ  D:  02/29/2008  T:  03/01/2008  Job:  [id number]

## 2010-07-28 NOTE — Op Note (Signed)
NAMEADRIENA, [name]               ACCOUNT NO.:  [id number]   MEDICAL RECORD NO.:  [id number]          PATIENT TYPE:  INP   LOCATION:  9105                          FACILITY:  [facility]   PHYSICIAN:  Sherron Monday, MD        DATE OF BIRTH:  [birth year]   DATE OF PROCEDURE:  02/26/2008  DATE OF DISCHARGE:                               OPERATIVE REPORT   PREOPERATIVE DIAGNOSES:  Intrauterine pregnancy at term, history of low-  transverse cesarean section, desires repeat.   POSTOPERATIVE DIAGNOSES:  Intrauterine pregnancy at term, history of low-  transverse cesarean section, desires repeat, delivered.   PROCEDURE:  Repeat low-transverse cesarean section.   SURGEON:  Sherron Monday, MD   ANESTHESIA:  Spinal.   FINDINGS:  Viable female infant at 13 with Apgars of 8 at one minute  and 9 at five minutes and a weight of 5 pounds 13 ounces.  Normal  uterus, tubes, and ovaries.   SPECIMENS:  None.   COMPLICATIONS:  None.   ESTIMATED BLOOD LOSS:  600 mL.   IV FLUIDS:  3000 mL.   URINE OUTPUT:  100 mL of clear urine at the end of the procedure.   DISPOSITION:  Stable to PACU following procedure.   PROCEDURE:  After informed consent was reviewed with the patient  including risks, benefits, and alternatives of surgical procedure, she  was transferred in stable condition to the operating room, placed on the  table where spinal anesthesia was induced and found to be adequate.  She  was then placed on the table in supine position with leftward tilt,  prepped and draped in the normal sterile fashion.  A Foley catheter was  sterilely placed.  Pfannenstiel skin incision was made at the level of  her previous incision, carried through the underlying layer of fascia  sharply.  The fascia was incised in the midline and the incision was  extended laterally with Mayo scissors.  Inferior aspect of the fascial  incision was grasped with Kocher clamps, elevated, and the rectus  muscles were dissected off  both bluntly and sharply.  Attention was  turned to the superior aspect of the fascial incision, which in a  similar fashion grasped with Kocher clamps, elevated, and the rectus  muscles were dissected off both bluntly and sharply.  The midline was  easily identified.  Peritoneum was entered bluntly.  Peritoneal incision  was extended superiorly and inferiorly with good visualization of the  bladder.  An Alexis skin retractor was then placed.  Placement was  checked to make sure that no bowel was entrapped.  Vesicouterine  peritoneum was easily identified and using smooth pickups and Metzenbaum  scissors, a bladder flap was created digitally and sharply.  Uterus was  then incised in transverse fashion, infant was delivered from a vertex  presentation with the aid of a Kiwi Vacuum.  Cord was clamped and cut on  the field.  Mouth and nose were suctioned.  The infant was handed off to  awaiting NICU staff.  Placenta was expressed from the uterus.  The  uterus was cleared  of all clots and debris and closed with 2 layers of 0-  Monocryl, the first as a running locked and the second as an imbricating  layer.  Hemostasis was assured with Bovie cautery.  Copious irrigation  of the pelvis was performed.  The rectus muscles were made hemostatic.  The fascia was closed with 0-Vicryl in a running fashion.  The  subcuticular adipose layer was irrigated, made hemostatic with Bovie  cautery, and reapproximated using 3-0 plain gut, and skin was closed  with staples.  The patient tolerated this procedure well.  Sponge, lap,  and needle counts were correct x2 at the end of the procedure.      Sherron Monday, MD  Electronically Signed     JB/MEDQ  D:  02/26/2008  T:  02/27/2008  Job:  [id number]

## 2010-10-08 ENCOUNTER — Encounter (INDEPENDENT_AMBULATORY_CARE_PROVIDER_SITE_OTHER): Payer: Self-pay | Admitting: General Surgery

## 2010-12-18 LAB — CBC
Hemoglobin: 10.7 g/dL — ABNORMAL LOW (ref 12.0–15.0)
Hemoglobin: 12 g/dL (ref 12.0–15.0)
MCHC: 34.5 g/dL (ref 30.0–36.0)
MCV: 89.6 fL (ref 78.0–100.0)
Platelets: 224 10*3/uL (ref 150–400)
Platelets: 272 10*3/uL (ref 150–400)
RBC: 3.45 MIL/uL — ABNORMAL LOW (ref 3.87–5.11)
RDW: 13.3 % (ref 11.5–15.5)
RDW: 13.5 % (ref 11.5–15.5)
WBC: 7.7 10*3/uL (ref 4.0–10.5)

## 2010-12-18 LAB — RPR: RPR Ser Ql: NONREACTIVE

## 2011-03-26 ENCOUNTER — Encounter (INDEPENDENT_AMBULATORY_CARE_PROVIDER_SITE_OTHER): Payer: Self-pay | Admitting: General Surgery

## 2011-03-26 ENCOUNTER — Ambulatory Visit (INDEPENDENT_AMBULATORY_CARE_PROVIDER_SITE_OTHER): Payer: 59 | Admitting: General Surgery

## 2011-03-26 VITALS — BP 124/86 | HR 64 | Temp 97.8°F | Resp 18 | Ht 67.0 in | Wt 178.6 lb

## 2011-03-26 DIAGNOSIS — Z9884 Bariatric surgery status: Secondary | ICD-10-CM

## 2011-03-26 NOTE — Progress Notes (Signed)
Chief complaint: Followup lap band  History: Patient returns for followup of lap band placed in March 2011. I last saw her in May of last year when she had some over restriction and we removed a quarter cc. Following that she has experienced very appropriate restriction at about a cup of food. She does not have any nausea or vomiting or nighttime regurgitation. She has good prolonged satiety with a small meal. She is continued gradual weight loss and now has lost 100 pounds. He is very happy with her success.  Exam: BP 124/86  Pulse 64  Temp(Src) 97.8 F (36.6 C) (Temporal)  Resp 18  Ht 5\' 7"  (1.702 m)  Wt 178 lb 9.6 oz (81.012 kg)  BMI 27.97 kg/m2  Gen.: Healthy-appearing female Abdomen: Soft and nontender. Incisions and port site looks fine.  Assessment and plan: Doing extremely well following lap band surgery with excellent weight loss and no complications identified. She does not need an adjustment. She is to return in one year.

## 2011-07-12 ENCOUNTER — Ambulatory Visit: Payer: 59 | Admitting: *Deleted

## 2011-12-18 ENCOUNTER — Emergency Department (HOSPITAL_COMMUNITY)
Admission: EM | Admit: 2011-12-18 | Discharge: 2011-12-18 | Disposition: A | Discharge status: Home / Self Care | Payer: 59 | Attending: Emergency Medicine | Admitting: Emergency Medicine

## 2011-12-18 ENCOUNTER — Emergency Department (HOSPITAL_COMMUNITY)
Admission: EM | Admit: 2011-12-18 | Discharge: 2011-12-18 | Disposition: A | Discharge status: Home / Self Care | Payer: 59 | Source: Home / Self Care

## 2011-12-18 ENCOUNTER — Encounter (HOSPITAL_COMMUNITY): Payer: Self-pay | Admitting: Emergency Medicine

## 2011-12-18 DIAGNOSIS — Z888 Allergy status to other drugs, medicaments and biological substances status: Secondary | ICD-10-CM

## 2011-12-18 DIAGNOSIS — T7840XA Allergy, unspecified, initial encounter: Secondary | ICD-10-CM

## 2011-12-18 DIAGNOSIS — L0501 Pilonidal cyst with abscess: Secondary | ICD-10-CM

## 2011-12-18 MED ORDER — AMOXICILLIN-POT CLAVULANATE 875-125 MG PO TABS: 1.0000 | ORAL_TABLET | Freq: Two times a day (BID) | ORAL | Status: DC

## 2011-12-18 MED ORDER — CEPHALEXIN 500 MG PO CAPS: 500.0000 mg | ORAL_CAPSULE | Freq: Four times a day (QID) | ORAL | Status: DC

## 2011-12-18 MED ORDER — TRAMADOL HCL 50 MG PO TABS: 50.0000 mg | ORAL_TABLET | Freq: Four times a day (QID) | ORAL | Status: DC | PRN

## 2011-12-18 MED ORDER — HYDROCODONE-ACETAMINOPHEN 5-500 MG PO TABS: 1.0000 | ORAL_TABLET | Freq: Four times a day (QID) | ORAL | Status: DC | PRN

## 2011-12-18 NOTE — ED Notes (Signed)
Pt states that she was seen in the ER last night and had I & D of a cyst. Pt was prescribed keflex and hydrocodone. Pt states that after taking the keflex hot, sweaty, having ringing in her ears and felt like she was going to pass out.  Pt states that she layed down to take a nap and with waking still having the same symptoms but was very disoriented. Pt states that she is unable to take pills so she had to open capsule and crush pain med in order to take med.  Hx lap band 3 yrs ago (can't take pills whole)

## 2011-12-18 NOTE — ED Provider Notes (Signed)
History     CSN: [id number]  Arrival date & time 12/18/11  1616   None     Chief Complaint  Patient presents with  . Medication Reaction    (Consider location/radiation/quality/duration/timing/severity/associated sxs/prior treatment) HPI Comments: This 31 year old female seen in the emergency department about 7:00 this morning with a pilonidal cyst. She received an incision and drainage, the wound was packed and dressed, and she received Norco 5 mg and Keflex 500 mg outpatient treatment. She took the medications approximately 1120 this morning and shortly after developed sweating dizziness, tinnitus, sensation of impending syncope. This was followed by taking a nap. Apparently during the nap she woke up and took another Norco and want her to other Keflex. She does not remember this. She takes did her boyfriend and he arrived at 3 PM found her "unconscious". Sleepy , confused and disoriented. In the past couple of hours she has felt much better and most all the symptoms or ameliorating. Her primary complaint now is discomfort at the I&D site and anxiety. She says she has a history of panic attacks and feels like she may be starting one now. Although she said she was having some hard time breathing earlier in the afternoon this too has abated. She only she is breathing normally   History reviewed. No pertinent past medical history.  Past Surgical History  Procedure Date  . Cesarean section 2003, 2009  . Laparoscopic gastric banding 05/19/2009    Family History  Problem Relation Age of Onset  . Cancer Mother     breast    History  Substance Use Topics  . Smoking status: Never Smoker   . Smokeless tobacco: Not on file  . Alcohol Use: Yes     social    OB History    Grav Para Term Preterm Abortions TAB SAB Ect Mult Living                  Review of Systems  Constitutional: Negative for fever, chills, activity change and fatigue.  HENT: Negative.   Respiratory: Positive for  shortness of breath. Negative for wheezing.   Cardiovascular: Negative for chest pain and palpitations.  Gastrointestinal: Positive for nausea. Negative for vomiting.  Genitourinary: Negative.   Musculoskeletal: Negative.   Skin: Negative.   Neurological: Positive for dizziness and speech difficulty. Negative for seizures, numbness and headaches.  Psychiatric/Behavioral: The patient is nervous/anxious.     Allergies  Review of patient's allergies indicates no known allergies.  Home Medications   Current Outpatient Rx  Name Route Sig Dispense Refill  . ADULT MULTIVITAMIN W/MINERALS CH Oral Take 1 tablet by mouth daily.    . AMOXICILLIN-POT CLAVULANATE 875-125 MG PO TABS Oral Take 1 tablet by mouth every 12 (twelve) hours. 14 tablet 0  . TRAMADOL HCL 50 MG PO TABS Oral Take 1 tablet (50 mg total) by mouth every 6 (six) hours as needed for pain. 15 tablet 0    BP 136/86  Pulse 62  Temp 98.5 F (36.9 C) (Oral)  Resp 21  SpO2 100%  LMP 12/06/2011  Physical Exam  Constitutional: She is oriented to person, place, and time. She appears well-developed and well-nourished. No distress.  HENT:  Head: Normocephalic and atraumatic.  Mouth/Throat: Oropharynx is clear and moist. No oropharyngeal exudate.       Airway widely patent  Eyes: EOM are normal. Pupils are equal, round, and reactive to light.  Neck: Normal range of motion. Neck supple.  Cardiovascular: Normal rate, regular  rhythm and normal heart sounds.   Pulmonary/Chest: Effort normal and breath sounds normal. No respiratory distress. She has no wheezes. She has no rales.  Abdominal: Soft. There is no tenderness.  Musculoskeletal: Normal range of motion. She exhibits no edema and no tenderness.  Lymphadenopathy:    She has no cervical adenopathy.  Neurological: She is alert and oriented to person, place, and time. No cranial nerve deficit.  Skin: Skin is warm and dry.       No peripheral or mucosal edema.  Psychiatric: She  has a normal mood and affect.    ED Course  Procedures (including critical care time)  Labs Reviewed - No data to display No results found.   1. Drug-induced hypersensitivity reaction       MDM  Stop the Keflex and hydrocodone. Start tramadol 50 mg 1 by mouth every 6 hours when necessary pain Start Augmentin 875 one twice a day for 7 days. For worsening new symptoms or problems may return otherwise followup as instructed from the emergency department and/or with your PCP. This most likely represents a hypersensitivity  reaction to the hydrocodone, although I can't be sure that the Keflex did not contribute to some of the symptoms. She is naive to narcotics. She also has rather severe anxiety with panic attacks, this too may have contributed to the symptoms. In any event the symptoms previously prescribed and experienced have abated. There are no signs of there was a true allergic reaction.        Hayden Rasmussen, NP 12/18/11 1755

## 2011-12-18 NOTE — ED Provider Notes (Signed)
Medical screening examination/treatment/procedure(s) were performed by non-physician practitioner and as supervising physician I was immediately available for consultation/collaboration.  Raynald Blend, MD 12/18/11 1757

## 2011-12-18 NOTE — ED Notes (Signed)
Suction set up in room with Yankauer tip and I & D tray at bedside.

## 2011-12-18 NOTE — ED Notes (Addendum)
Pt states that after her morning workout yesterday she noticed tailbone pain.  Pt is unable to describe if she feels it is muscle pain or the actual bone.  Pt denies numbness/tingling.

## 2011-12-18 NOTE — ED Provider Notes (Signed)
History     CSN: [id number]  Arrival date & time 12/18/11  4098   First MD Initiated Contact with Patient 12/18/11 [phone]      Chief Complaint  Patient presents with  . Tailbone Pain    (Consider location/radiation/quality/duration/timing/severity/associated sxs/prior treatment) HPI The patient presents to the emergency department with tailbone pain.  She states the pain began after her run yesterday and is experiencing worsening symptoms this morning.  The pain is throbbing, constant, and worse with sitting.  The patient has not taken any OTC medications for pain and feels better when she flexes her lower back.  She denies injury, radiating pain, weakness, and urinary or bowel changes.    History reviewed. No pertinent past medical history.  Past Surgical History  Procedure Date  . Cesarean section 2003, 2009  . Laparoscopic gastric banding 05/19/2009    Family History  Problem Relation Age of Onset  . Cancer Mother     breast    History  Substance Use Topics  . Smoking status: Never Smoker   . Smokeless tobacco: Not on file  . Alcohol Use: Yes     social    OB History    Grav Para Term Preterm Abortions TAB SAB Ect Mult Living                  Review of Systems All pertinent positives and negatives found in HPI.  Allergies  Review of patient's allergies indicates no known allergies.  Home Medications   Current Outpatient Rx  Name Route Sig Dispense Refill  . ADULT MULTIVITAMIN W/MINERALS CH Oral Take 1 tablet by mouth daily.      BP 127/76  Pulse 69  Temp 98.6 F (37 C) (Oral)  Resp 18  SpO2 99%  LMP 12/06/2011  Physical Exam  Constitutional: She is oriented to person, place, and time. She appears well-developed and well-nourished. No distress.  Neck: Normal range of motion. Neck supple.  Musculoskeletal: Normal range of motion.       Lumbar back: She exhibits tenderness, bony tenderness and swelling. She exhibits normal range of motion.    Neurological: She is alert and oriented to person, place, and time. She has normal strength and normal reflexes. She displays normal reflexes. She exhibits normal muscle tone. Coordination normal.  Skin: Skin is warm and dry. No rash noted. No erythema. No pallor.    ED Course  Procedures (including critical care time)  Pt with pilonidal abscess on exam. She is afebrile, otherwise non toxic.  INCISION AND DRAINAGE Performed by: Jaynie Crumble A Consent: Verbal consent obtained. Risks and benefits: risks, benefits and alternatives were discussed Type: abscess  Body area: sacrum  Anesthesia: local infiltration  Local anesthetic: lidocaine 2% w/ epinephrine  Anesthetic total: 3 ml  Complexity: complex Blunt dissection to break up loculations  Drainage: purulent  Drainage amount: moderate  Packing material: 1/4 in iodoform gauze  Patient tolerance: Patient tolerated the procedure well with no immediate complications.   Abcess I&Ded. Packed. Will start on antibiotics, pain meds, follow up in 2 days for recheck.   Filed Vitals:   12/18/11 0735  BP: 127/76  Pulse: 69  Temp: 98.6 F (37 C)  Resp: 18     1. Pilonidal cyst with abscess       MDM          Lottie Mussel, PA 12/18/11 1108

## 2011-12-18 NOTE — ED Notes (Signed)
Pt c/o tailbone pain onset yesterday. Pt denies injury, reports that she does a lot of stretching.

## 2011-12-19 NOTE — ED Provider Notes (Signed)
Medical screening examination/treatment/procedure(s) were performed by non-physician practitioner and as supervising physician I was immediately available for consultation/collaboration.   Nessa Ramaker, MD 12/19/11 0731 

## 2011-12-20 ENCOUNTER — Encounter (HOSPITAL_COMMUNITY): Payer: Self-pay | Admitting: Emergency Medicine

## 2011-12-20 ENCOUNTER — Emergency Department (HOSPITAL_COMMUNITY)
Admission: EM | Admit: 2011-12-20 | Discharge: 2011-12-20 | Disposition: A | Discharge status: Home / Self Care | Payer: 59 | Attending: Emergency Medicine | Admitting: Emergency Medicine

## 2011-12-20 DIAGNOSIS — Z803 Family history of malignant neoplasm of breast: Secondary | ICD-10-CM | POA: Insufficient documentation

## 2011-12-20 DIAGNOSIS — Z885 Allergy status to narcotic agent status: Secondary | ICD-10-CM | POA: Insufficient documentation

## 2011-12-20 DIAGNOSIS — L0501 Pilonidal cyst with abscess: Secondary | ICD-10-CM

## 2011-12-20 DIAGNOSIS — Z4800 Encounter for change or removal of nonsurgical wound dressing: Secondary | ICD-10-CM | POA: Insufficient documentation

## 2011-12-20 NOTE — ED Provider Notes (Signed)
I  reviewed the resident's note and I agree with the findings and plan.  Cheri Guppy, MD 12/20/11 [phone]

## 2011-12-20 NOTE — ED Notes (Signed)
Pt is here for a wound check.  Has been taking Keflex as prescribed.

## 2011-12-20 NOTE — ED Provider Notes (Signed)
History     CSN: [id number]  Arrival date & time 12/20/11  0740   First MD Initiated Contact with Patient 12/20/11 [phone]      Chief Complaint  Patient presents with  . Wound Check    (Consider location/radiation/quality/duration/timing/severity/associated sxs/prior treatment) HPI CC: Pilonidal cyst abscess  Pilonidal cyst abscess: Gauze in place and packing in place. Minimal pain, controlled w/ ibuprofen. Continues to have drainage. Continues to take Keflex as prescribed.  Denies fever, n/v/d/c/ rash  History reviewed. No pertinent past medical history.  Past Surgical History  Procedure Date  . Cesarean section 2003, 2009  . Laparoscopic gastric banding 05/19/2009    Family History  Problem Relation Age of Onset  . Cancer Mother     breast    History  Substance Use Topics  . Smoking status: Never Smoker   . Smokeless tobacco: Not on file  . Alcohol Use: Yes     social    OB History    Grav Para Term Preterm Abortions TAB SAB Ect Mult Living                  Review of Systems Per hpi Allergies  Vicodin  Home Medications   Current Outpatient Rx  Name Route Sig Dispense Refill  . AMOXICILLIN-POT CLAVULANATE 875-125 MG PO TABS Oral Take 1 tablet by mouth every 12 (twelve) hours. 14 tablet 0  . IBUPROFEN 200 MG PO TABS Oral Take 100 mg by mouth every 6 (six) hours as needed. For pain    . ADULT MULTIVITAMIN W/MINERALS CH Oral Take 1 tablet by mouth daily.    . TRAMADOL HCL 50 MG PO TABS Oral Take 1 tablet (50 mg total) by mouth every 6 (six) hours as needed for pain. 15 tablet 0    BP 118/67  Pulse 54  Temp 98.3 F (36.8 C)  Resp 16  SpO2 100%  LMP 12/06/2011  Physical Exam Gen: NAD Skin: incision w/ iodiform gauze in place. 1x1cm induration around area of incision, w/ minimal drainage. No skin breakdown or rash  ED Course  Procedures (including critical care time) Packing removed and replaced. Clean gauze placed Labs Reviewed - No data to  display No results found.   No diagnosis found.    MDM   31yo female w/ resolving pilonidal cyst abscess s/p I&D doing very well. Initial I&D on 10/5. iodoform packing replaced. Wound care instructions given to pt. Pt to continue ABX for 4 more days, and careful instructions regarding need for further evaluation given. Pt to f/u w/ PCP and UC as necessary       Ozella Rocks, MD 12/20/11 1191  Ozella Rocks, MD 12/20/11 [phone]

## 2011-12-31 ENCOUNTER — Ambulatory Visit (INDEPENDENT_AMBULATORY_CARE_PROVIDER_SITE_OTHER): Payer: 59 | Admitting: Medical

## 2011-12-31 ENCOUNTER — Encounter: Payer: Self-pay | Admitting: Medical

## 2011-12-31 VITALS — BP 110/80 | HR 60 | Temp 98.2°F | Resp 16 | Ht 68.5 in | Wt 160.0 lb

## 2011-12-31 DIAGNOSIS — Z0000 Encounter for general adult medical examination without abnormal findings: Secondary | ICD-10-CM

## 2011-12-31 DIAGNOSIS — Z23 Encounter for immunization: Secondary | ICD-10-CM

## 2011-12-31 DIAGNOSIS — K029 Dental caries, unspecified: Secondary | ICD-10-CM

## 2011-12-31 DIAGNOSIS — Z113 Encounter for screening for infections with a predominantly sexual mode of transmission: Secondary | ICD-10-CM

## 2011-12-31 DIAGNOSIS — Z803 Family history of malignant neoplasm of breast: Secondary | ICD-10-CM

## 2011-12-31 LAB — COMPREHENSIVE METABOLIC PANEL
ALT: 25 U/L (ref 0–35)
AST: 18 U/L (ref 0–37)
Alkaline Phosphatase: 41 U/L (ref 39–117)
Calcium: 9.4 mg/dL (ref 8.4–10.5)
Glucose, Bld: 86 mg/dL (ref 70–99)
Sodium: 142 mEq/L (ref 135–145)
Total Bilirubin: 0.4 mg/dL (ref 0.3–1.2)

## 2011-12-31 LAB — POCT URINALYSIS DIPSTICK: Nitrite, UA: NEGATIVE

## 2011-12-31 LAB — HIV ANTIBODY (ROUTINE TESTING W REFLEX): HIV: NONREACTIVE

## 2011-12-31 LAB — CBC
HCT: 36.8 % (ref 36.0–46.0)
Hemoglobin: 12.2 g/dL (ref 12.0–15.0)
MCHC: 33.2 g/dL (ref 30.0–36.0)
MCV: 89.8 fL (ref 78.0–100.0)
RBC: 4.1 MIL/uL (ref 3.87–5.11)
RDW: 12.2 % (ref 11.5–15.5)
WBC: 6.6 10*3/uL (ref 4.0–10.5)

## 2011-12-31 LAB — LIPID PANEL
HDL: 60 mg/dL (ref >39)
VLDL: 7 mg/dL (ref 0–40)
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
## 2011-12-31 NOTE — Patient Instructions (Signed)
See dentist soon about the cavity.  Otherwise continue healthy lifestyle.   Preventative Care for Adults - Female      MAINTAIN REGULAR HEALTH EXAMS:  A routine yearly physical is a good way to check in with your primary care provider about your health and preventive screening. It is also an opportunity to share updates about your health and any concerns you have, and receive a thorough all-over exam.   Most health insurance companies pay for at least some preventative services.  Check with your health plan for specific coverages.  WHAT PREVENTATIVE SERVICES DO WOMEN NEED?  Adult women should have their weight and blood pressure checked regularly.   Women age 24 and older should have their cholesterol levels checked regularly.  Women should be screened for cervical cancer with a Pap smear and pelvic exam beginning at either age 34, or 3 years after they become sexually activity.    Breast cancer screening generally begins at age 61 with a mammogram and breast exam by your primary care provider.    Beginning at age 81 and continuing to age 24, women should be screened for colorectal cancer.  Certain people may need continued testing until age 59.  Updating vaccinations is part of preventative care.  Vaccinations help protect against diseases such as the flu.  Osteoporosis is a disease in which the bones lose minerals and strength as we age. Women ages 71 and over should discuss this with their caregivers, as should women after menopause who have other risk factors.  Lab tests are generally done as part of preventative care to screen for anemia and blood disorders, to screen for problems with the kidneys and liver, to screen for bladder problems, to check blood sugar, and to check your cholesterol level.  Preventative services generally include counseling about diet, exercise, avoiding tobacco, drugs, excessive alcohol consumption, and sexually transmitted infections.    GENERAL  RECOMMENDATIONS FOR GOOD HEALTH:  Healthy diet:  Eat a variety of foods, including fruit, vegetables, animal or vegetable protein, such as meat, fish, chicken, and eggs, or beans, lentils, tofu, and grains, such as rice.  Drink plenty of water daily.  Decrease saturated fat in the diet, avoid lots of red meat, processed foods, sweets, fast foods, and fried foods.  Exercise:  Aerobic exercise helps maintain good heart health. At least 30-40 minutes of moderate-intensity exercise is recommended. For example, a brisk walk that increases your heart rate and breathing. This should be done on most days of the week.   Find a type of exercise or a variety of exercises that you enjoy so that it becomes a part of your daily life.  Examples are running, walking, swimming, water aerobics, and biking.  For motivation and support, explore group exercise such as aerobic class, spin class, Zumba, Yoga,or  martial arts, etc.    Set exercise goals for yourself, such as a certain weight goal, walk or run in a race such as a 5k walk/run.  Speak to your primary care provider about exercise goals.  Disease prevention:  If you smoke or chew tobacco, find out from your caregiver how to quit. It can literally save your life, no matter how long you have been a tobacco user. If you do not use tobacco, never begin.   Maintain a healthy diet and normal weight. Increased weight leads to problems with blood pressure and diabetes.   The Body Mass Index or BMI is a way of measuring how much of your body  is fat. Having a BMI above 27 increases the risk of heart disease, diabetes, hypertension, stroke and other problems related to obesity. Your caregiver can help determine your BMI and based on it develop an exercise and dietary program to help you achieve or maintain this important measurement at a healthful level.  High blood pressure causes heart and blood vessel problems.  Persistent high blood pressure should be treated  with medicine if weight loss and exercise do not work.   Fat and cholesterol leaves deposits in your arteries that can block them. This causes heart disease and vessel disease elsewhere in your body.  If your cholesterol is found to be high, or if you have heart disease or certain other medical conditions, then you may need to have your cholesterol monitored frequently and be treated with medication.   Ask if you should have a cardiac stress test if your history suggests this. A stress test is a test done on a treadmill that looks for heart disease. This test can find disease prior to there being a problem.  Menopause can be associated with physical symptoms and risks. Hormone replacement therapy is available to decrease these. You should talk to your caregiver about whether starting or continuing to take hormones is right for you.   Osteoporosis is a disease in which the bones lose minerals and strength as we age. This can result in serious bone fractures. Risk of osteoporosis can be identified using a bone density scan. Women ages 31 and over should discuss this with their caregivers, as should women after menopause who have other risk factors. Ask your caregiver whether you should be taking a calcium supplement and Vitamin D, to reduce the rate of osteoporosis.   Avoid drinking alcohol in excess (more than two drinks per day).  Avoid use of street drugs. Do not share needles with anyone. Ask for professional help if you need assistance or instructions on stopping the use of alcohol, cigarettes, and/or drugs.  Brush your teeth twice a day with fluoride toothpaste, and floss once a day. Good oral hygiene prevents tooth decay and gum disease. The problems can be painful, unattractive, and can cause other health problems. Visit your dentist for a routine oral and dental check up and preventive care every 6-12 months.   Look at your skin regularly.  Use a mirror to look at your back. Notify your  caregivers of changes in moles, especially if there are changes in shapes, colors, a size larger than a pencil eraser, an irregular border, or development of new moles.  Safety:  Use seatbelts 100% of the time, whether driving or as a passenger.  Use safety devices such as hearing protection if you work in environments with loud noise or significant background noise.  Use safety glasses when doing any work that could send debris in to the eyes.  Use a helmet if you ride a bike or motorcycle.  Use appropriate safety gear for contact sports.  Talk to your caregiver about gun safety.  Use sunscreen with a SPF (or skin protection factor) of 15 or greater.  Lighter skinned people are at a greater risk of skin cancer. Don't forget to also wear sunglasses in order to protect your eyes from too much damaging sunlight. Damaging sunlight can accelerate cataract formation.   Practice safe sex. Use condoms. Condoms are used for birth control and to help reduce the spread of sexually transmitted infections (or STIs).  Some of the STIs are gonorrhea (the clap),  chlamydia, syphilis, trichomonas, herpes, HPV (human papilloma virus) and HIV (human immunodeficiency virus) which causes AIDS. The herpes, HIV and HPV are viral illnesses that have no cure. These can result in disability, cancer and death.   Keep carbon monoxide and smoke detectors in your home functioning at all times. Change the batteries every 6 months or use a model that plugs into the wall.   Vaccinations:  Stay up to date with your tetanus shots and other required immunizations. You should have a booster for tetanus every 10 years. Be sure to get your flu shot every year, since 5%-20% of the [place] population comes down with the flu. The flu vaccine changes each year, so being vaccinated once is not enough. Get your shot in the fall, before the flu season peaks.   Other vaccines to consider:  Human Papilloma Virus or HPV causes cancer of the cervix,  and other infections that can be transmitted from person to person. There is a vaccine for HPV, and females should get immunized between the ages of 36 and 42. It requires a series of 3 shots.   Pneumococcal vaccine to protect against certain types of pneumonia.  This is normally recommended for adults age 11 or older.  However, adults younger than 31 years old with certain underlying conditions such as diabetes, heart or lung disease should also receive the vaccine.  Shingles vaccine to protect against Varicella Zoster if you are older than age 56, or younger than 31 years old with certain underlying illness.  Hepatitis A vaccine to protect against a form of infection of the liver by a virus acquired from food.  Hepatitis B vaccine to protect against a form of infection of the liver by a virus acquired from blood or body fluids, particularly if you work in health care.  If you plan to travel internationally, check with your local health department for specific vaccination recommendations.  Cancer Screening:  Breast cancer screening is essential to preventive care for women. All women age 36 and older should perform a breast self-exam every month. At age 2 and older, women should have their caregiver complete a breast exam each year. Women at ages 52 and older should have a mammogram (x-ray film) of the breasts. Your caregiver can discuss how often you need mammograms.    Cervical cancer screening includes taking a Pap smear (sample of cells examined under a microscope) from the cervix (end of the uterus). It also includes testing for HPV (Human Papilloma Virus, which can cause cervical cancer). Screening and a pelvic exam should begin at age 25, or 3 years after a woman becomes sexually active. Screening should occur every year, with a Pap smear but no HPV testing, up to age 80. After age 30, you should have a Pap smear every 3 years with HPV testing, if no HPV was found previously.   Most  routine colon cancer screening begins at the age of 46. On a yearly basis, doctors may provide special easy to use take-home tests to check for hidden blood in the stool. Sigmoidoscopy or colonoscopy can detect the earliest forms of colon cancer and is life saving. These tests use a small camera at the end of a tube to directly examine the colon. Speak to your caregiver about this at age 47, when routine screening begins (and is repeated every 5 years unless early forms of pre-cancerous polyps or small growths are found).

## 2012-01-01 LAB — SYPHILIS: RPR W/REFLEX TO RPR TITER AND TREPONEMAL ANTIBODIES, TRADITIONAL SCREENING AND DIAGNOSIS ALGORITHM

## 2012-01-01 LAB — GC/CHLAMYDIA PROBE AMP, URINE
Chlamydia, Swab/Urine, PCR: NEGATIVE
GC Probe Amp, Urine: NEGATIVE

## 2012-03-30 ENCOUNTER — Encounter (INDEPENDENT_AMBULATORY_CARE_PROVIDER_SITE_OTHER): Payer: 59

## 2012-04-28 ENCOUNTER — Encounter (INDEPENDENT_AMBULATORY_CARE_PROVIDER_SITE_OTHER): Payer: 59 | Admitting: General Surgery

## 2012-05-13 ENCOUNTER — Emergency Department (HOSPITAL_COMMUNITY)
Admission: EM | Admit: 2012-05-13 | Discharge: 2012-05-13 | Disposition: A | Discharge status: Home / Self Care | Payer: 59 | Source: Home / Self Care

## 2012-05-13 ENCOUNTER — Encounter (HOSPITAL_COMMUNITY): Payer: Self-pay | Admitting: Emergency Medicine

## 2012-05-13 DIAGNOSIS — L02416 Cutaneous abscess of left lower limb: Secondary | ICD-10-CM

## 2012-05-13 DIAGNOSIS — L02419 Cutaneous abscess of limb, unspecified: Secondary | ICD-10-CM

## 2012-05-13 MED ORDER — LIDOCAINE HCL (PF) 1 % IJ SOLN
INTRAMUSCULAR | Status: AC
  Filled 2012-05-13: qty 5

## 2012-05-13 MED ORDER — CLINDAMYCIN HCL 300 MG PO CAPS: 300.0000 mg | ORAL_CAPSULE | Freq: Two times a day (BID) | ORAL | Status: DC

## 2012-05-13 MED ORDER — CEFTRIAXONE SODIUM 1 G IJ SOLR
INTRAMUSCULAR | Status: AC
  Filled 2012-05-13: qty 10

## 2012-05-13 MED ORDER — CEFTRIAXONE SODIUM 1 G IJ SOLR
1.0000 g | Freq: Once | INTRAMUSCULAR | Status: AC
  Administered 2012-05-13: 1 g via INTRAMUSCULAR

## 2012-05-13 NOTE — ED Provider Notes (Signed)
Medical screening examination/treatment/procedure(s) were performed by resident physician or non-physician practitioner and as supervising physician I was immediately available for consultation/collaboration.   Barkley Bruns MD.   Linna Hoff, MD 05/13/12 [phone]
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
## 2012-05-13 NOTE — ED Notes (Signed)
Pt is here for an insect bite onset last Saturday on left leg/shin Sx include: drainage, itching, tender, swelling at site Denies: f/v/n/d  Has tried warm compressions  She is alert w/no signs of acute distress.

## 2012-05-17 ENCOUNTER — Telehealth (HOSPITAL_COMMUNITY): Payer: Self-pay | Admitting: *Deleted

## 2012-05-17 LAB — CULTURE, ROUTINE-ABSCESS

## 2012-05-17 NOTE — ED Notes (Signed)
Abscess culture chin:  Abundant MRSA. Pt. adequately treated with Cleocin. I called and left a message to call. Vassie Moselle 05/17/2012

## 2012-05-23 ENCOUNTER — Telehealth (HOSPITAL_COMMUNITY): Payer: Self-pay | Admitting: *Deleted

## 2012-05-23 NOTE — ED Notes (Signed)
Pt. called back.  Pt. verified x 2 and given results.  Pt. told she was adequately treated with Cleocin. Pt. given the [facility] MRSA instructions and voiced understanding. Vassie Moselle 05/23/2012

## 2012-05-24 ENCOUNTER — Encounter (INDEPENDENT_AMBULATORY_CARE_PROVIDER_SITE_OTHER): Payer: Self-pay | Admitting: General Surgery

## 2012-05-24 ENCOUNTER — Ambulatory Visit (INDEPENDENT_AMBULATORY_CARE_PROVIDER_SITE_OTHER): Payer: 59 | Admitting: General Surgery

## 2012-05-24 VITALS — BP 118/70 | HR 63 | Temp 98.2°F | Resp 18 | Ht 66.0 in | Wt 154.6 lb

## 2012-05-24 DIAGNOSIS — Z9884 Bariatric surgery status: Secondary | ICD-10-CM
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
## 2012-09-28 ENCOUNTER — Telehealth (INDEPENDENT_AMBULATORY_CARE_PROVIDER_SITE_OTHER): Payer: Self-pay

## 2012-09-28 ENCOUNTER — Encounter (INDEPENDENT_AMBULATORY_CARE_PROVIDER_SITE_OTHER): Payer: 59

## 2012-09-29 NOTE — Telephone Encounter (Signed)
Called patient to inquire why she did not come to her appointment, left a voicemail asking her to call in and reschedule appt.

## 2012-10-09 ENCOUNTER — Encounter (INDEPENDENT_AMBULATORY_CARE_PROVIDER_SITE_OTHER): Payer: Self-pay | Admitting: Physician Assistant

## 2013-01-16 ENCOUNTER — Telehealth (INDEPENDENT_AMBULATORY_CARE_PROVIDER_SITE_OTHER): Payer: Self-pay

## 2013-01-16 NOTE — Telephone Encounter (Signed)
Pt called stating she had an episode one week ago that she vomited 4 times in one day. She has noted some LLQ abd discomfort level 3-5 that comes and goes. No nausea. No vomiting since then. Tolerating her diet well. No difficulty swallowing or drinking. No abd swelling or bloating. BM normal. She states she does have persistent cough that she has has since getting over a cold a couple of weeks ago. Pt wanted to have Dr Johna Sheriff review this concern since she has had lab band in past. Pt wanted to be sure she has not dislodged her band when she vomited. Pt advised I will sent this msg to Dr Johna Sheriff to review. Pt can be reached at [phone].

## 2013-01-18 ENCOUNTER — Other Ambulatory Visit: Payer: Self-pay

## 2013-01-22 NOTE — Telephone Encounter (Signed)
Called and left message for patient to call our office to see how patient is feeling.

## 2013-03-26 ENCOUNTER — Ambulatory Visit (INDEPENDENT_AMBULATORY_CARE_PROVIDER_SITE_OTHER): Payer: 59 | Admitting: Medical

## 2013-03-26 ENCOUNTER — Encounter: Payer: Self-pay | Admitting: Medical

## 2013-03-26 VITALS — BP 120/70 | HR 60 | Temp 98.0°F | Resp 16 | Wt 161.0 lb

## 2013-03-26 DIAGNOSIS — L0292 Furuncle, unspecified: Secondary | ICD-10-CM

## 2013-03-26 DIAGNOSIS — L259 Unspecified contact dermatitis, unspecified cause: Secondary | ICD-10-CM

## 2013-03-26 DIAGNOSIS — Z2089 Contact with and (suspected) exposure to other communicable diseases: Secondary | ICD-10-CM

## 2013-03-26 DIAGNOSIS — L0293 Carbuncle, unspecified: Secondary | ICD-10-CM

## 2013-03-26 DIAGNOSIS — Z20818 Contact with and (suspected) exposure to other bacterial communicable diseases: Secondary | ICD-10-CM

## 2013-03-26 DIAGNOSIS — L309 Dermatitis, unspecified: Secondary | ICD-10-CM

## 2013-03-26 MED ORDER — TRIAMCINOLONE ACETONIDE 0.1 % EX CREA
1.0000 | TOPICAL_CREAM | Freq: Two times a day (BID) | CUTANEOUS | Status: DC
Start: 2013-03-26 — End: 2013-04-16

## 2013-03-26 MED ORDER — DOXYCYCLINE HYCLATE 100 MG PO TABS: 100.0000 mg | ORAL_TABLET | Freq: Two times a day (BID) | ORAL | Status: DC

## 2013-03-26 MED ORDER — MUPIROCIN 2 % EX OINT: 1.0000 "application " | TOPICAL_OINTMENT | Freq: Two times a day (BID) | CUTANEOUS | Status: DC

## 2013-03-26 NOTE — Patient Instructions (Signed)
MRSA Overview  MRSA stands for methicillin-resistant Staphylococcus aureus. It is a type of bacteria that is resistant to some common antibiotics. It can cause infections in the skin and many other places in the body. Staphylococcus aureus, often called "staph," is a bacteria that normally lives on the skin or in the nose. Staph on the surface of the skin or in the nose does not cause problems. However, if the staph enters the body through a cut, wound, or break in the skin, an infection can happen.  Up until recently, infections with the MRSA type of staph mainly occurred in hospitals and other healthcare settings. There are now increasing problems with MRSA infections in the community as well. Infections with MRSA may be very serious or even life-threatening. Most MRSA infections are acquired in one of two ways:  · Healthcare-associated MRSA (HA-MRSA)  · This can be acquired by people in any healthcare setting. MRSA can be a big problem for hospitalized people, people in nursing homes, people in rehabilitation facilities, people with weakened immune systems, dialysis patients, and those who have had surgery.  · Community-associated MRSA (CA-MRSA)  · Community spread of MRSA is becoming more common. It is known to spread in crowded settings, in jails and prisons, and in situations where there is close skin-to-skin contact, such as during sporting events or in locker rooms. MRSA can be spread through shared items, such as children's toys, razors, towels, or sports equipment.  CAUSES   All staph, including MRSA, are normally harmless unless they enter the body through a scratch, cut, or wound, such as with surgery. All staph, including MRSA, can be spread from person-to-person by touching contaminated objects or through direct contact.  SPECIAL GROUPS  MRSA can present problems for special groups of people. Some of these groups include:  · Breastfeeding women.  · The most common problem is MRSA infection of the  breast (mastitis). There is evidence that MRSA can be passed to an infant from infected breast milk. Your caregiver may recommend that you stop breastfeeding until the mastitis is under control.  · If you are breastfeeding and have a MRSA infection in a place other than the breast, you may usually continue breastfeeding while under treatment. If taking antibiotics, ask your caregiver if it is safe to continue breastfeeding while taking your prescribed medicines.  · Neonates (babies from birth to 1 month old) and infants (babies from 1 month to 1 year old).  · There is evidence that MRSA can be passed to a newborn at birth if the mother has MRSA on the skin, in or around the birth canal, or an infection in the uterus, cervix, or vagina. MRSA infection can have the same appearance as a normal newborn or infant rash or several other skin infections. This can make it hard to diagnose MRSA.  · Immune compromised people.  · If you have an immune system problem, you may have a higher chance of developing a MRSA infection.  · People after any type of surgery.  · Staph in general, including MRSA, is the most common cause of infections occurring at the site of recent surgery.  · People on long-term steroid medicines.  · These kinds of medicines can lower your resistance to infection. This can increase your chance of getting MRSA.  · People who have had frequent hospitalizations, live in nursing homes or other residential care facilities, have venous or urinary catheters, or have taken multiple courses of antibiotic therapy for any reason.    DIAGNOSIS   Diagnosis of MRSA is done by cultures of fluid samples that may come from:  · Swabs taken from cuts or wounds in infected areas.  · Nasal swabs.  · Saliva or deep cough specimens from the lungs (sputum).  · Urine.  · Blood.  Many people are "colonized" with MRSA but have no signs of infection. This means that people carry the MRSA germ on their skin or in their nose and may  never develop MRSA infection.   TREATMENT   Treatment varies and is based on how serious, how deep, or how extensive the infection is. For example:  · Some skin infections, such as a small boil or abscess, may be treated by draining yellowish-white fluid (pus) from the site of the infection.  · Deeper or more widespread soft tissue infections are usually treated with surgery to drain pus and with antibiotic medicine given by vein or by mouth. This may be recommended even if you are pregnant.  · Serious infections may require a hospital stay.  If antibiotics are given, they may be needed for several weeks.  PREVENTION   Because many people are colonized with staph, including MRSA, preventing the spread of the bacteria from person-to-person is most important. The best way to prevent the spread of bacteria and other germs is through proper hand washing or by using alcohol-based hand disinfectants. The following are other ways to help prevent MRSA infection within the hospital and community settings.   · Healthcare settings:  · Strict hand washing or hand disinfection procedures need to be followed before and after touching every patient.  · Patients infected with MRSA are placed in isolation to prevent the spread of the bacteria.  · Healthcare workers need to wear disposable gowns and gloves when touching or caring for patients infected with MRSA. Visitors may also be asked to wear a gown and gloves.  · Hospital surfaces need to be disinfected frequently.  · Community settings:  · Wash your hands frequently with soap and water for at least 15 seconds. Otherwise, use alcohol-based hand disinfectants when soap and water is not available.  · Make sure people who live with you wash their hands often, too.  · Do not share personal items. For example, avoid sharing razors and other personal hygiene items, towels, clothing, and athletic equipment.  · Wash and dry your clothes and bedding at the warmest temperatures  recommended on the labels.  · Keep wounds covered. Pus from infected sores may contain MRSA and other bacteria. Keep cuts and abrasions clean and covered with germ-free (sterile), dry bandages until they are healed.  · If you have a wound that appears infected, ask your caregiver if a culture for MRSA and other bacteria should be done.  · If you are breastfeeding, talk to your caregiver about MRSA. You may be asked to temporarily stop breastfeeding.  HOME CARE INSTRUCTIONS   · Take your antibiotics as directed. Finish them even if you start to feel better.  · Avoid close contact with those around you as much as possible. Do not use towels, razors, toothbrushes, bedding, or other items that will be used by others.  · To fight the infection, follow your caregiver's instructions for wound care. Wash your hands before and after changing your bandages.  · If you have an intravascular device, such as a catheter, make sure you know how to care for it.  · Be sure to tell any healthcare providers that you have MRSA   so they are aware of your infection.  SEEK IMMEDIATE MEDICAL CARE IF:   · The infection appears to be getting worse. Signs include:  · Increased warmth, redness, or tenderness around the wound site.  · A red line that extends from the infection site.  · A dark color in the area around the infection.  · Wound drainage that is tan, yellow, or green.  · A bad smell coming from the wound.  · You feel sick to your stomach (nauseous) and throw up (vomit) or cannot keep medicine down.  · You have a fever.  · Your baby is older than 3 months with a rectal temperature of 102° F (38.9° C) or higher.  · Your baby is 3 months old or younger with a rectal temperature of 100.4° F (38° C) or higher.  · You have difficulty breathing.  MAKE SURE YOU:   · Understand these instructions.  · Will watch your condition.  · Will get help right away if you are not doing well or get worse.  Document Released: 03/01/2005 Document Revised:  05/24/2011 Document Reviewed: 06/03/2010  Exit[employer]® Patient Information ©2014 [employer].
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
## 2013-04-16 ENCOUNTER — Ambulatory Visit (INDEPENDENT_AMBULATORY_CARE_PROVIDER_SITE_OTHER): Payer: 59 | Admitting: Family Medicine

## 2013-04-16 VITALS — BP 114/70 | HR 58 | Wt 161.0 lb

## 2013-04-16 DIAGNOSIS — M25559 Pain in unspecified hip: Secondary | ICD-10-CM

## 2013-04-16 DIAGNOSIS — M25561 Pain in right knee: Secondary | ICD-10-CM

## 2013-04-16 DIAGNOSIS — M25569 Pain in unspecified knee: Secondary | ICD-10-CM

## 2013-04-16 DIAGNOSIS — M25562 Pain in left knee: Principal | ICD-10-CM

## 2013-04-16 NOTE — Progress Notes (Signed)
   Subjective:    Patient ID: [name], female    DOB: [birth year], 33 y.o.   MRN: [id number]  HPI She is here for evaluation of lower abdominal and pelvic discomfort. She runs daily usually 5 miles. Yesterday she ran 16 miles. She usually runs this distance on weekends regularly. One week ago after a 15 K. run she noted pain in the right lateral thigh and bilateral knees. This one was much more strenuous than she was used to.  She continued to run and only took one day off. The pain usually occurs after she finishes the run. The pain would go way in roughly one hour. Yesterday she ran 16 miles and she again experienced pain in the right lateral thigh, bilateral knees after the run and also noted pelvic discomfort. It bothers her continuously and with physical activity  Review of Systems     Objective:   Physical Exam Alert and in no distress. Slight tenderness to palpation just superior to the pubic symphysis. No abdominal muscle tenderness however. Full motion of the hip. Ober test is negative. Knee exam bilaterally shows no patellar tendon tenderness. Compression test negative. Medial and lateral collateral ligaments intact. McMurray's testing negative.       Assessment & Plan:  Knee pain, bilateral  Pain in joint, pelvic region and thigh  explained that this is mostly related to overuse due to her more strenuous than normal 15 K. run a week ago and subsequent pain. Recommend she back off on running and use anti-inflammatory. Did recommend swimming or cycling to keep her self in physical condition. Discussed the need for her to return to physical activities slowly once the pain is gone. Return here in 2 weeks for followup.

## 2013-04-16 NOTE — Patient Instructions (Signed)
Take Advil  regularly for the next week. You may take 4 pills 3 times per day. No running but I want you on a stationary bicycle or swimming to keep himself in shape. If you're pain-free after that you can start back at half your distance slowly increasing it over the next 2 weeks to back to where you were before. Each day off requires 2 to return

## 2013-04-30 ENCOUNTER — Encounter: Payer: Self-pay | Admitting: Family Medicine

## 2013-04-30 ENCOUNTER — Ambulatory Visit: Payer: 59 | Admitting: Family Medicine

## 2013-04-30 ENCOUNTER — Ambulatory Visit (INDEPENDENT_AMBULATORY_CARE_PROVIDER_SITE_OTHER): Payer: 59 | Admitting: Family Medicine

## 2013-04-30 VITALS — BP 120/64 | HR 68 | Wt 157.0 lb

## 2013-04-30 DIAGNOSIS — M79661 Pain in right lower leg: Secondary | ICD-10-CM

## 2013-04-30 DIAGNOSIS — M79609 Pain in unspecified limb: Secondary | ICD-10-CM

## 2013-04-30 NOTE — Progress Notes (Signed)
   Subjective:    Patient ID: [name], female    DOB: 1981/03/08, 33 y.o.   MRN: [id number]  HPI She is here for a recheck. She states that all of her pain is now gone away except pain in the right mid calf area. She states that after running roughly 2 miles this pain does go away. She has started running again and is preparing for potential triathlon and marathon in the next several months.   Review of Systems     Objective:   Physical Exam  Exam of the right calf shows no swelling or tenderness. Specifically the mid calf area has no pain.      Assessment & Plan:  Pain of right calf  discussed the possibility of this being plantaris tendon issue although it morbid line them localizing to the medial side. We have decided to let her work through this and if it goes away, no further intervention however if she has more difficulty, I will set her up for an ultrasound. Also discussed her workout schedule. She was thinking about doing a mini triathlon and 2 weeks later a marathon. Explained that this is asking a lot of her body and that I would recommend against trying to do too much too soon. Discussed her training regimen and encouraged her to include other kinds of activities rather than just running.

## 2013-04-30 NOTE — Patient Instructions (Signed)
If you continue to have trouble with this give me a call and I will get you set up to get an ultrasound

## 2013-06-14 ENCOUNTER — Encounter (INDEPENDENT_AMBULATORY_CARE_PROVIDER_SITE_OTHER): Payer: 59 | Admitting: General Surgery

## 2013-06-21 ENCOUNTER — Encounter (INDEPENDENT_AMBULATORY_CARE_PROVIDER_SITE_OTHER): Payer: Self-pay | Admitting: General Surgery

## 2013-08-03 ENCOUNTER — Ambulatory Visit: Payer: 59 | Admitting: Medical

## 2013-08-03 ENCOUNTER — Encounter: Payer: Self-pay | Admitting: Family Medicine

## 2013-08-03 ENCOUNTER — Ambulatory Visit (INDEPENDENT_AMBULATORY_CARE_PROVIDER_SITE_OTHER): Payer: 59 | Admitting: Family Medicine

## 2013-08-03 VITALS — BP 120/80 | HR 56 | Ht 66.0 in | Wt 159.0 lb

## 2013-08-03 DIAGNOSIS — L03319 Cellulitis of trunk, unspecified: Secondary | ICD-10-CM

## 2013-08-03 DIAGNOSIS — L02219 Cutaneous abscess of trunk, unspecified: Secondary | ICD-10-CM

## 2013-08-03 DIAGNOSIS — L02214 Cutaneous abscess of groin: Secondary | ICD-10-CM

## 2013-08-03 MED ORDER — SULFAMETHOXAZOLE-TMP DS 800-160 MG PO TABS: 1.0000 | ORAL_TABLET | Freq: Two times a day (BID) | ORAL | Status: DC
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
## 2013-08-03 NOTE — Patient Instructions (Signed)
There is no evidence of infection currently. Likely is just a cyst, possible ingrown hair.  Use moist warm compresses, let the hair grow out. Change your razor  Start the antibiotics only if it increases in size and becomes painful.

## 2013-08-07 ENCOUNTER — Ambulatory Visit: Payer: 59 | Admitting: Medical

## 2013-08-22 ENCOUNTER — Encounter (INDEPENDENT_AMBULATORY_CARE_PROVIDER_SITE_OTHER): Payer: 59 | Admitting: General Surgery

## 2015-06-17 ENCOUNTER — Encounter: Payer: Self-pay | Admitting: Medical

## 2015-06-17 ENCOUNTER — Ambulatory Visit (INDEPENDENT_AMBULATORY_CARE_PROVIDER_SITE_OTHER): Payer: Commercial Managed Care - HMO | Admitting: Medical

## 2015-06-17 VITALS — BP 102/60 | HR 100 | Wt 159.0 lb

## 2015-06-17 DIAGNOSIS — M2142 Flat foot [pes planus] (acquired), left foot: Secondary | ICD-10-CM | POA: Diagnosis not present

## 2015-06-17 DIAGNOSIS — M79671 Pain in right foot: Secondary | ICD-10-CM

## 2015-06-17 DIAGNOSIS — M2141 Flat foot [pes planus] (acquired), right foot: Secondary | ICD-10-CM | POA: Diagnosis not present
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
## 2015-06-19 ENCOUNTER — Telehealth: Payer: Self-pay | Admitting: Medical

## 2015-06-19 NOTE — Telephone Encounter (Signed)
Please inquire on status of the foot xray?  I wanted to find out, as I am out all next week, and since the order is routed to me, if she gets the xray next week, she wouldn't hear back until the following Monday

## 2015-06-20 NOTE — Telephone Encounter (Signed)
LMTCB

## 2015-06-23 NOTE — Telephone Encounter (Signed)
LMTCB

## 2015-06-24 ENCOUNTER — Ambulatory Visit (INDEPENDENT_AMBULATORY_CARE_PROVIDER_SITE_OTHER): Payer: Commercial Managed Care - HMO | Admitting: Family Medicine

## 2015-06-24 ENCOUNTER — Encounter: Payer: Self-pay | Admitting: Family Medicine

## 2015-06-24 VITALS — BP 125/72 | HR 56 | Ht 67.0 in | Wt 154.0 lb

## 2015-06-24 DIAGNOSIS — M25571 Pain in right ankle and joints of right foot: Secondary | ICD-10-CM

## 2015-06-24 NOTE — Patient Instructions (Signed)
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 Your ultrasound is reassuring otherwise. Ice the area 15 minutes at a time 3-4 times a day. Ibuprofen 600mg  three times a day with food OR aleve 2 tabs twice a day with food for at least 7-10 days then as needed (ok to take regularly until I see you back though as long as it's not irritating your stomach). Arch supports are very important - wear these at all times or shoes with a good arch (like your crocs). Avoid flat shoes, barefoot walking as much as possible. Cross training with elliptical, swimming, cycling. Ok to return to walk: jog program when pain is less than 3 on a scale of 1-10, increase amount of jogging and total exercise time every other day. Follow up with me in 6 weeks for reevaluation.

## 2015-06-24 NOTE — Telephone Encounter (Signed)
FYI: Have not been able to contact pt about xray left 3 VMs

## 2015-06-25 DIAGNOSIS — M25571 Pain in right ankle and joints of right foot: Secondary | ICD-10-CM | POA: Insufficient documentation
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
## 2015-06-25 NOTE — Assessment & Plan Note (Signed)
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  Icing, regular nsaids.  Stressed importance of arch support, avoiding flat shoes and barefoot walking.  Cross train.  Discussed return to running program and how to star tthis.  F/u in 6 weeks.  Can consider nitro patches, injection if still struggling.

## 2015-08-05 ENCOUNTER — Ambulatory Visit: Payer: Commercial Managed Care - HMO | Admitting: Family Medicine

## 2016-01-28 ENCOUNTER — Ambulatory Visit (INDEPENDENT_AMBULATORY_CARE_PROVIDER_SITE_OTHER): Payer: Commercial Managed Care - HMO | Admitting: Medical

## 2016-01-28 ENCOUNTER — Encounter: Payer: Self-pay | Admitting: Medical

## 2016-01-28 DIAGNOSIS — M542 Cervicalgia: Secondary | ICD-10-CM | POA: Diagnosis not present

## 2016-01-28 DIAGNOSIS — M79602 Pain in left arm: Secondary | ICD-10-CM | POA: Diagnosis not present

## 2016-01-28 DIAGNOSIS — M79605 Pain in left leg: Secondary | ICD-10-CM

## 2016-01-28 DIAGNOSIS — M549 Dorsalgia, unspecified: Secondary | ICD-10-CM | POA: Insufficient documentation

## 2016-01-28 DIAGNOSIS — M62838 Other muscle spasm: Secondary | ICD-10-CM | POA: Diagnosis not present

## 2016-01-28 MED ORDER — METHOCARBAMOL 500 MG PO TABS: ORAL_TABLET | ORAL | 0 refills | Status: DC

## 2016-01-28 MED ORDER — DICLOFENAC SODIUM 75 MG PO TBEC: 75.0000 mg | DELAYED_RELEASE_TABLET | Freq: Two times a day (BID) | ORAL | 0 refills | Status: DC
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
## 2016-02-16 ENCOUNTER — Ambulatory Visit: Payer: Commercial Managed Care - HMO | Admitting: Medical

## 2016-02-16 ENCOUNTER — Telehealth: Payer: Self-pay

## 2016-02-16 NOTE — Telephone Encounter (Signed)
D

## 2016-02-16 NOTE — Telephone Encounter (Signed)

## 2016-02-19 ENCOUNTER — Encounter: Payer: Self-pay | Admitting: Medical

## 2016-02-19 ENCOUNTER — Ambulatory Visit (INDEPENDENT_AMBULATORY_CARE_PROVIDER_SITE_OTHER): Payer: Commercial Managed Care - HMO | Admitting: Medical

## 2016-02-19 VITALS — BP 116/80 | HR 60 | Wt 161.8 lb

## 2016-02-19 DIAGNOSIS — M5416 Radiculopathy, lumbar region: Secondary | ICD-10-CM | POA: Insufficient documentation

## 2016-02-19 DIAGNOSIS — M79605 Pain in left leg: Secondary | ICD-10-CM

## 2016-02-19 DIAGNOSIS — M5442 Lumbago with sciatica, left side: Secondary | ICD-10-CM | POA: Diagnosis not present

## 2016-02-19 MED ORDER — DICLOFENAC SODIUM 75 MG PO TBEC: 75.0000 mg | DELAYED_RELEASE_TABLET | Freq: Two times a day (BID) | ORAL | 0 refills | Status: DC

## 2016-02-19 MED ORDER — HYDROCODONE-ACETAMINOPHEN 5-325 MG PO TABS: 1.0000 | ORAL_TABLET | Freq: Four times a day (QID) | ORAL | 0 refills | Status: DC | PRN

## 2016-02-19 MED ORDER — METHOCARBAMOL 500 MG PO TABS: ORAL_TABLET | ORAL | 0 refills | Status: DC
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
## 2016-02-19 NOTE — Telephone Encounter (Signed)
No show letter sent.

## 2016-03-16 DIAGNOSIS — M545 Low back pain: Secondary | ICD-10-CM | POA: Diagnosis not present

## 2016-03-16 DIAGNOSIS — M25571 Pain in right ankle and joints of right foot: Secondary | ICD-10-CM | POA: Diagnosis not present

## 2016-03-16 DIAGNOSIS — M6281 Muscle weakness (generalized): Secondary | ICD-10-CM | POA: Diagnosis not present

## 2016-03-17 ENCOUNTER — Ambulatory Visit (INDEPENDENT_AMBULATORY_CARE_PROVIDER_SITE_OTHER): Payer: Commercial Managed Care - HMO | Admitting: Medical

## 2016-03-17 ENCOUNTER — Encounter: Payer: Self-pay | Admitting: Medical

## 2016-03-17 VITALS — BP 130/70 | HR 81 | Wt 158.2 lb

## 2016-03-17 DIAGNOSIS — M549 Dorsalgia, unspecified: Secondary | ICD-10-CM | POA: Diagnosis not present

## 2016-03-17 DIAGNOSIS — M5416 Radiculopathy, lumbar region: Secondary | ICD-10-CM

## 2016-03-17 MED ORDER — DICLOFENAC SODIUM 75 MG PO TBEC: 75.0000 mg | DELAYED_RELEASE_TABLET | Freq: Two times a day (BID) | ORAL | 1 refills | Status: DC
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
## 2016-03-26 DIAGNOSIS — M6281 Muscle weakness (generalized): Secondary | ICD-10-CM | POA: Diagnosis not present

## 2016-03-26 DIAGNOSIS — M25571 Pain in right ankle and joints of right foot: Secondary | ICD-10-CM | POA: Diagnosis not present

## 2016-03-26 DIAGNOSIS — M545 Low back pain: Secondary | ICD-10-CM | POA: Diagnosis not present

## 2016-05-19 DIAGNOSIS — M9903 Segmental and somatic dysfunction of lumbar region: Secondary | ICD-10-CM | POA: Diagnosis not present

## 2016-05-19 DIAGNOSIS — M9905 Segmental and somatic dysfunction of pelvic region: Secondary | ICD-10-CM | POA: Diagnosis not present

## 2016-05-19 DIAGNOSIS — M5431 Sciatica, right side: Secondary | ICD-10-CM | POA: Diagnosis not present

## 2016-09-30 ENCOUNTER — Ambulatory Visit (INDEPENDENT_AMBULATORY_CARE_PROVIDER_SITE_OTHER): Payer: 59 | Admitting: Family Medicine

## 2016-09-30 ENCOUNTER — Encounter: Payer: Self-pay | Admitting: Family Medicine

## 2016-09-30 VITALS — BP 110/70 | HR 63 | Temp 98.4°F | Wt 164.2 lb

## 2016-09-30 DIAGNOSIS — H6123 Impacted cerumen, bilateral: Secondary | ICD-10-CM | POA: Diagnosis not present

## 2016-09-30 DIAGNOSIS — H6122 Impacted cerumen, left ear: Secondary | ICD-10-CM | POA: Diagnosis not present

## 2016-09-30 NOTE — Progress Notes (Signed)
   Subjective:    Patient ID: [name], female    DOB: April 25, 1980, 36 y.o.   MRN: [id number]  HPI Chief Complaint  Patient presents with  . ear issues    washed hair on thursday or friday and got water in ears and it won't come out   She is here with complaints of both ears feeling clogged for the past 6 days since washing her hair and getting water in them. States sounds are muffled but denies ear pain.  No other complaints or concerns.   Denies fever, chills, rhinorrhea, nasal congestion, sore throat, cough.   Reviewed allergies, medications, past medical, surgical, and social history.    Review of Systems Pertinent positives and negatives in the history of present illness.     Objective:   Physical Exam BP 110/70   Pulse 63   Temp 98.4 F (36.9 C) (Oral)   Wt 164 lb 3.2 oz (74.5 kg)   BMI 25.72 kg/m   Alert and in no distress. Unable to visualize TMs due to cerumen impaction bilaterally. Post lavage, tympanic membranes and canals are normal. Pharyngeal area is normal. Neck is supple without adenopathy or thyromegaly.        Assessment & Plan:  Bilateral impacted cerumen  Hearing loss secondary to cerumen impaction, left  Ear lavage performed bilaterally due to cerumen impaction. Done by Martie Lee, CMA and patient tolerated this well. Post lavage patient exam is normal and she reports improved hearing and sensation.

## 2016-10-31 DIAGNOSIS — R404 Transient alteration of awareness: Secondary | ICD-10-CM | POA: Diagnosis not present

## 2016-10-31 DIAGNOSIS — R531 Weakness: Secondary | ICD-10-CM | POA: Diagnosis not present

## 2016-12-20 ENCOUNTER — Encounter (HOSPITAL_COMMUNITY): Payer: Self-pay

## 2017-05-25 ENCOUNTER — Encounter: Payer: 59 | Admitting: Medical

## 2017-05-25 DIAGNOSIS — Z0000 Encounter for general adult medical examination without abnormal findings: Secondary | ICD-10-CM

## 2017-06-03 ENCOUNTER — Encounter: Payer: Self-pay | Admitting: Medical

## 2017-06-03 NOTE — Progress Notes (Signed)
No shoew

## 2017-12-19 ENCOUNTER — Encounter (HOSPITAL_COMMUNITY): Payer: Self-pay

## 2018-04-05 DIAGNOSIS — Z4651 Encounter for fitting and adjustment of gastric lap band: Secondary | ICD-10-CM | POA: Diagnosis not present

## 2018-05-25 DIAGNOSIS — Z01419 Encounter for gynecological examination (general) (routine) without abnormal findings: Secondary | ICD-10-CM | POA: Diagnosis not present

## 2018-05-25 DIAGNOSIS — Z30433 Encounter for removal and reinsertion of intrauterine contraceptive device: Secondary | ICD-10-CM | POA: Diagnosis not present

## 2018-05-25 DIAGNOSIS — Z1389 Encounter for screening for other disorder: Secondary | ICD-10-CM | POA: Diagnosis not present

## 2018-05-25 DIAGNOSIS — Z124 Encounter for screening for malignant neoplasm of cervix: Secondary | ICD-10-CM | POA: Diagnosis not present

## 2018-05-26 DIAGNOSIS — Z124 Encounter for screening for malignant neoplasm of cervix: Secondary | ICD-10-CM | POA: Diagnosis not present

## 2018-12-15 ENCOUNTER — Encounter: Payer: Self-pay | Admitting: Sports Medicine

## 2018-12-15 ENCOUNTER — Ambulatory Visit: Payer: Self-pay

## 2018-12-15 ENCOUNTER — Other Ambulatory Visit: Payer: Self-pay

## 2018-12-15 ENCOUNTER — Ambulatory Visit
Admission: RE | Admit: 2018-12-15 | Discharge: 2018-12-15 | Disposition: A | Discharge status: Home / Self Care | Payer: 59 | Source: Ambulatory Visit | Attending: Sports Medicine | Admitting: Sports Medicine

## 2018-12-15 ENCOUNTER — Ambulatory Visit (INDEPENDENT_AMBULATORY_CARE_PROVIDER_SITE_OTHER): Payer: 59 | Admitting: Sports Medicine

## 2018-12-15 VITALS — BP 122/76 | Ht 67.0 in | Wt 175.0 lb

## 2018-12-15 DIAGNOSIS — M79671 Pain in right foot: Secondary | ICD-10-CM

## 2018-12-15 DIAGNOSIS — S92353A Displaced fracture of fifth metatarsal bone, unspecified foot, initial encounter for closed fracture: Secondary | ICD-10-CM | POA: Insufficient documentation

## 2018-12-15 DIAGNOSIS — S92351A Displaced fracture of fifth metatarsal bone, right foot, initial encounter for closed fracture: Secondary | ICD-10-CM

## 2018-12-15 IMAGING — DX DG FOOT COMPLETE 3+V*R*
3 series · 3 of 3 positions shown · non-contrast
Comparison: None.

CLINICAL DATA: Pain and swelling with injury 3 weeks prior

EXAM:
RIGHT FOOT COMPLETE - 3+ VIEW

[dg foot complete right (1 of 3)]
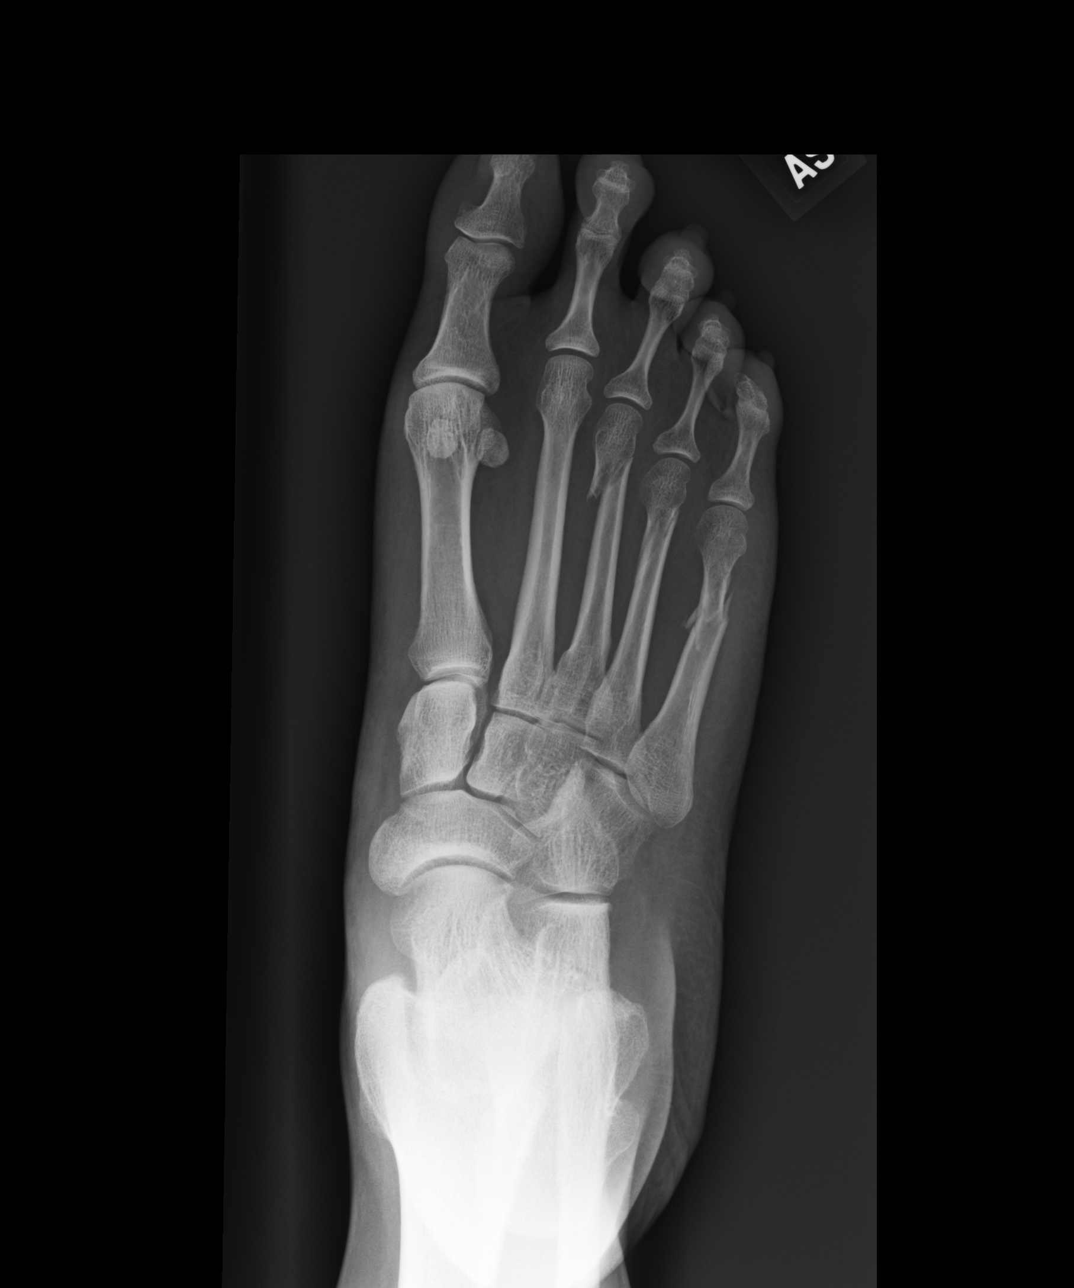

[dg foot complete right (2 of 3)]
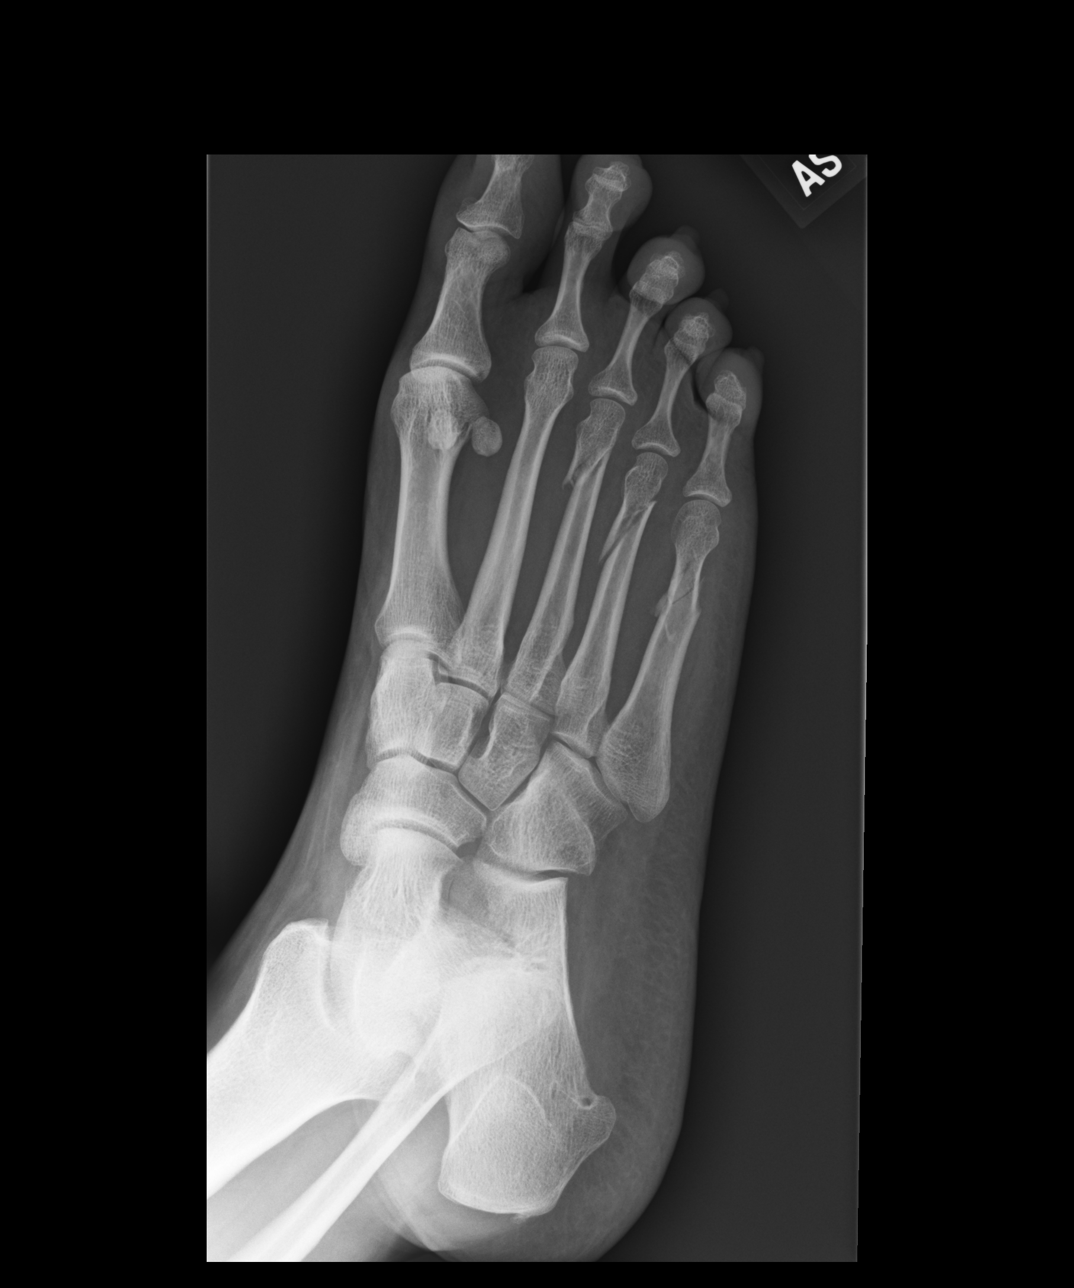

[dg foot complete right (3 of 3)]
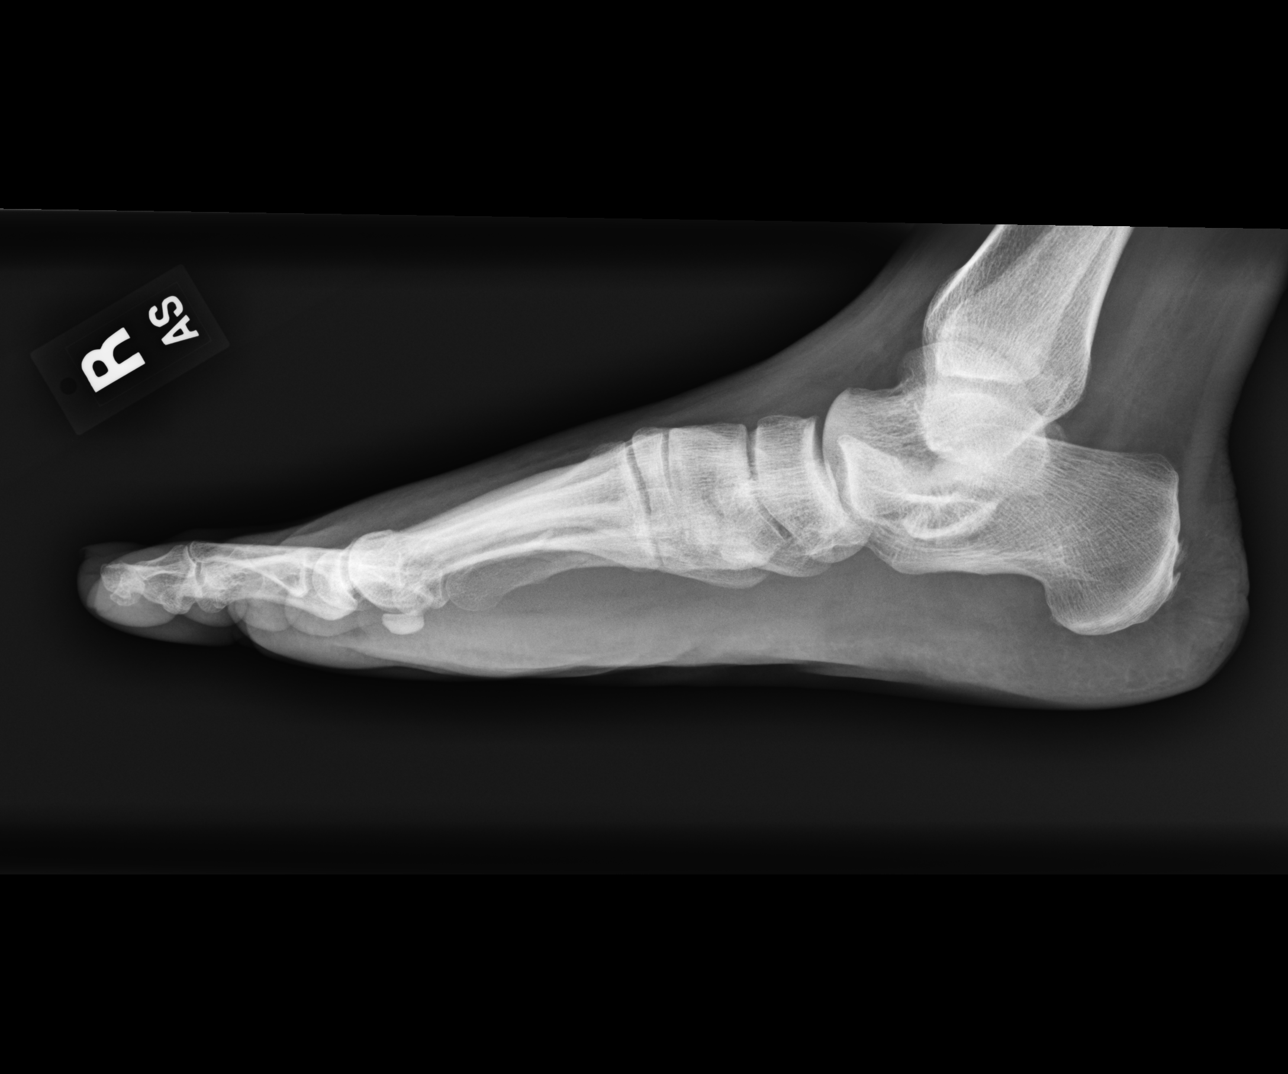

[3 of 3 positions shown; findings below may reference images not displayed]

FINDINGS: Frontal, oblique, and lateral views were obtained. They are is an
obliquely oriented fracture of the third distal metatarsal with
slight medial displacement of the distal fracture fragment with
respect to the proximal fragment. A similar appearing obliquely
oriented fracture of the distal fourth metatarsal is noted with
slight medial displacement of the distal fracture fragment with
respect to the proximal fragment. There is a comminuted fracture at
the junction of the mid and distal thirds of the fifth metatarsal
with slight impaction at the fracture site. Major fracture fragments
in this area are near anatomic in alignment.

No other fractures are evident. No dislocations. There is slight
joint space narrowing at the first MTP joint. Other joint spaces
appear normal. No erosive change. There is pes planus.
IMPRESSION: Fractures of the distal third, fourth, and fifth metatarsals with
mild displacement of fracture fragments at this third and fourth
metatarsal fracture sites and mild impaction at the fifth metatarsal
fracture site. No other fractures. No dislocation.

Mild narrowing first MTP joint. Other joint spaces appear
unremarkable. There is pes planus.

These results will be called to the ordering clinician or
representative by the Radiologist Assistant, and communication
documented in the PACS or zVision Dashboard.

## 2018-12-15 NOTE — Progress Notes (Signed)
Addendum:  Plan: 5th right metatarsal fracture  Patient has extensive tenderness to palpation along the 5th metatarsal with history of non-weight bearing after injury. Ultrasound on 5th metatarsal shows fracture. - DG right foot complete to get complete view of 5th metatarsal fracture and look for other injury.

## 2018-12-15 NOTE — Assessment & Plan Note (Signed)
Patient has extensive tenderness to palpation along the 5th metatarsal with history of non-weight bearing after injury. Ultrasound on 5th metatarsal shows fracture. - DG right foot complete to get complete view of 5th metatarsal fracture and look for other injury.

## 2018-12-15 NOTE — Patient Instructions (Signed)
Metatarsal Fracture A metatarsal fracture is a break in one of the five bones that connect the toes to the rest of the foot. This may also be called a forefoot fracture. A metatarsal fracture may be:  A crack in the surface of the bone (stress fracture). This often occurs in athletes.  A break all the way through the bone (complete fracture). The bone that connects to the little toe (fifth metatarsal) is most commonly fractured. Ballet dancers often fracture this bone. What are the causes? A metatarsal fracture may be caused by:  Sudden twisting of the foot.  Falling onto the foot.  Something heavy falling onto the foot.  Overuse or repetitive exercise. What increases the risk? This condition is more likely to develop in people who:  Play contact sports.  Do ballet.  Have a condition that causes the bones to become thin and brittle (osteoporosis).  Have a low calcium level. What are the signs or symptoms? Symptoms of this condition include:  Pain that gets worse when walking or standing.  Pain when pressing on the foot or moving the toes.  Swelling.  Bruising on the top or bottom of the foot. How is this diagnosed? This condition may be diagnosed based on:  Your symptoms.  Any recent foot injuries you have had.  A physical exam.  An X-ray of your foot. If you have a stress fracture, it may not show up on an X-ray, and you may need other imaging tests, such as: ? A bone scan. ? CT scan. ? MRI. How is this treated? Treatment depends on how severe your fracture is and how the pieces of the broken bone line up with each other (alignment). Treatment may involve:  Wearing a cast, splint, or supportive boot on your foot.  Using crutches, and not putting any weight on your foot.  Having surgery to align broken bones (open reduction and internal fixation, ORIF).  Physical therapy.  Follow-up visits and X-rays to make sure you are healing. Follow these instructions  at home: If you have a splint or a supportive boot:  Wear the splint or boot as told by your health care provider. Remove it only as told by your health care provider.  Loosen the splint or boot if your toes tingle, become numb, or turn cold and blue.  Keep the splint or boot clean.  If your splint or boot is not waterproof: ? Do not let it get wet. ? Cover it with a watertight covering when you take a bath or a shower. If you have a cast:  Do not stick anything inside the cast to scratch your skin. Doing that increases your risk for infection.  Check the skin around the cast every day. Tell your health care provider about any concerns.  You may put lotion on dry skin around the edges of the cast. Do not put lotion on the skin underneath the cast.  Keep the cast clean.  If the cast is not waterproof: ? Do not let it get wet. ? Cover it with a watertight covering when you take a bath or a shower. Activity  Do not use your affected leg to support your body weight until your health care provider says that you can. Use crutches as directed.  Ask your health care provider what activities are safe for you during recovery, and ask what activities you need to avoid.  Do physical therapy exercises as directed. Driving  Do not drive or use heavy machinery   while taking pain medicine.  Do not drive while wearing a cast, splint, or boot on a foot that you use for driving. Managing pain, stiffness, and swelling   If directed, put ice on painful areas: ? Put ice in a plastic bag. ? Place a towel between your skin and the bag.  If you have a removable splint or boot, remove it as told by your health care provider.  If you have a cast, place a towel between your cast and the bag. ? Leave the ice on for 20 minutes, 2-3 times a day.  Move your toes often to avoid stiffness and to lessen swelling.  Raise (elevate) your lower leg above the level of your heart while you are sitting or  lying down. General instructions  Do not put pressure on any part of the cast or splint until it is fully hardened. This may take several hours.  Take over-the-counter and prescription medicines only as told by your health care provider.  Do not use any products that contain nicotine or tobacco, such as cigarettes and e-cigarettes. These can delay bone healing. If you need help quitting, ask your health care provider.  Do not take baths, swim, or use a hot tub until your health care provider approves. Ask your health care provider if you may take showers.  Keep all follow-up visits as told by your health care provider. This is important. Contact a health care provider if you have:  Pain that gets worse or does not get better with medicine.  A fever.  A bad smell coming from your cast or splint. Get help right away if you have:  Any of the following in your toes or your foot, even after loosening your splint (if applicable): ? Numbness. ? Tingling. ? Coldness. ? Blue skin.  Redness or swelling that gets worse.  Pain that suddenly becomes severe. Summary  A metatarsal fracture is a break in one of the five bones that connect the toes to the rest of the foot.  Treatment depends on how severe your fracture is and how the pieces of the broken bone line up with each other (alignment). This may include wearing a cast, splint, or supportive boot, or using crutches. Sometimes surgery is needed to align the bones.  Ice and elevate your foot to help lessen the pain and swelling.  Make sure you know what symptoms should cause you to get help right away. This information is not intended to replace advice given to you by your health care provider. Make sure you discuss any questions you have with your health care provider. Document Released: 11/21/2001 Document Revised: 06/22/2018 Document Reviewed: 03/28/2017 Elsevier Patient Education  2020 [employer]  

## 2018-12-15 NOTE — Addendum Note (Signed)
Addended by: Inez Catalina on: 12/15/2018 11:07 AM   Modules accepted: Orders

## 2018-12-15 NOTE — Progress Notes (Addendum)
Subjective: HPI: [name] is a 38 y.o. presenting to clinic today to discuss the following:  Right Foot Pain [name] is a 37y/o female who is an avid runner and runs marathons. She was doing a speed run outside 3 weeks ago when during a step down she had sharp intense immediate pain. She could not walk after the event and when she got home she had swelling and pain along the lateral dosrum of her right foot. She applied ice, elevation, rest, and took Advil which improved her pain and swelling. She can now walk on it but can not run due to pain. She had a similar injury several years ago to the same foot but on the medial side. She has had no history of ankle sprains and no surgery to her right foot.   ROS noted in HPI.    Social History   Tobacco Use  Smoking Status Never Smoker  Smokeless Tobacco Never Used    Objective: BP 122/76   Ht 5\' 7"  (1.702 m)   Wt 175 lb (79.4 kg)   BMI 27.41 kg/m  Vitals and nursing notes reviewed  Physical Exam Gen: Alert and Oriented x 3, NAD HEENT: Normocephalic, atraumatic MSK: Ankle/Foot, Right: TTP noted at the 5th metatarsal. No visible erythema, swelling, ecchymosis, or bony deformity. Notable pes planus/cavus deformity. Transverse arch grossly intact; No evidence of tibiotalar deviation; Range of motion is full in all directions. Strength is 5/5 in all directions. No tenderness at the insertion/body/myotendinous junction of the Achilles tendon; No peroneal tendon tenderness or subluxation; No tenderness on posterior aspects of lateral and medial malleolus; Stable lateral and medial ligaments; Unremarkable squeezetests; Talar dome nontender; Unremarkable calcaneal squeeze; No plantar calcaneal tenderness; No tenderness over the navicular prominence; No tenderness over cuboid; Pain at base of 5th MT; No tenderness at the distal metatarsals; Able to walk 4 steps.  Ext: no clubbing, cyanosis, or edema Skin: warm, dry, intact, no  rashes  Imaging: Right 5th Metatarsal U/S: Base of the 5th metatarsal shows no break in the cortical bone, peroneus brevis tendon is attached at the base. 5th metatarsal body is fractured with distal 1/3 displaced medially and inferior. Impression: comminuted fracture of the 5th metarsal  Assessment/Plan:  No problem-specific Assessment & Plan notes found for this encounter.  PATIENT EDUCATION PROVIDED: See AVS    Diagnosis and plan along with any newly prescribed medication(s) were discussed in detail with this patient today. The patient verbalized understanding and agreed with the plan. Patient advised if symptoms worsen return to clinic or ER.   Orders Placed This Encounter  Procedures  . LIMITED JOINT SPACE STRUCTURES LOW RIGHT    Standing Status:   Future    Number of Occurrences:   1    Standing Expiration Date:   02/14/2020    Order Specific Question:   Reason for Exam (SYMPTOM  OR DIAGNOSIS REQUIRED)    Answer:   right foot pain    Order Specific Question:   Preferred imaging location?    Answer:   Internal  . DG Foot Complete Right    Standing Status:   Future    Standing Expiration Date:   02/14/2020    Order Specific Question:   Reason for Exam (SYMPTOM  OR DIAGNOSIS REQUIRED)    Answer:   right foot pain    Order Specific Question:   Is patient pregnant?    Answer:   No    Order Specific  Question:   Preferred imaging location?    Answer:   [facility]    Order Specific Question:   Radiology Contrast Protocol - do NOT remove file path    Answer:   [email or web address]    Harolyn Rutherford, DO 12/15/2018, 9:13 AM PGY-3 [facility]  I was the preceptor for this visit and available for immediate consultation to both the resident and the sports medicine fellow. X-rays show mildly displaced fractures of the third, fourth, and fifth metatarsals.  Case was discussed with Dr. Griffin Basil who recommends conservative  treatment.  Patient will remain in her cam walker and follow-up in 3 weeks. Shellia Cleverly, DO

## 2018-12-18 ENCOUNTER — Other Ambulatory Visit: Payer: 59

## 2018-12-18 NOTE — Addendum Note (Signed)
Addended by: Jolinda Croak E on: 12/18/2018 01:31 PM   Modules accepted: Orders

## 2019-01-17 ENCOUNTER — Ambulatory Visit (INDEPENDENT_AMBULATORY_CARE_PROVIDER_SITE_OTHER): Payer: 59 | Admitting: Sports Medicine

## 2019-01-17 ENCOUNTER — Encounter: Payer: Self-pay | Admitting: Sports Medicine

## 2019-01-17 ENCOUNTER — Ambulatory Visit
Admission: RE | Admit: 2019-01-17 | Discharge: 2019-01-17 | Disposition: A | Discharge status: Home / Self Care | Payer: 59 | Source: Ambulatory Visit | Attending: Sports Medicine | Admitting: Sports Medicine

## 2019-01-17 ENCOUNTER — Other Ambulatory Visit: Payer: Self-pay

## 2019-01-17 VITALS — BP 118/78 | Ht 67.0 in | Wt 175.0 lb

## 2019-01-17 DIAGNOSIS — S92334D Nondisplaced fracture of third metatarsal bone, right foot, subsequent encounter for fracture with routine healing: Secondary | ICD-10-CM

## 2019-01-17 DIAGNOSIS — S92344D Nondisplaced fracture of fourth metatarsal bone, right foot, subsequent encounter for fracture with routine healing: Secondary | ICD-10-CM

## 2019-01-17 DIAGNOSIS — S92351A Displaced fracture of fifth metatarsal bone, right foot, initial encounter for closed fracture: Secondary | ICD-10-CM

## 2019-01-17 DIAGNOSIS — S92354D Nondisplaced fracture of fifth metatarsal bone, right foot, subsequent encounter for fracture with routine healing: Secondary | ICD-10-CM | POA: Diagnosis not present

## 2019-01-17 IMAGING — CR DG FOOT COMPLETE 3+V*R*
3 series · 3 of 3 positions shown · non-contrast
Comparison: Right foot radiographs [DATE]

CLINICAL DATA: Hx multiple metatarsal fx's, ? healing

EXAM:
RIGHT FOOT COMPLETE - 3+ VIEW

[t foot ap right]
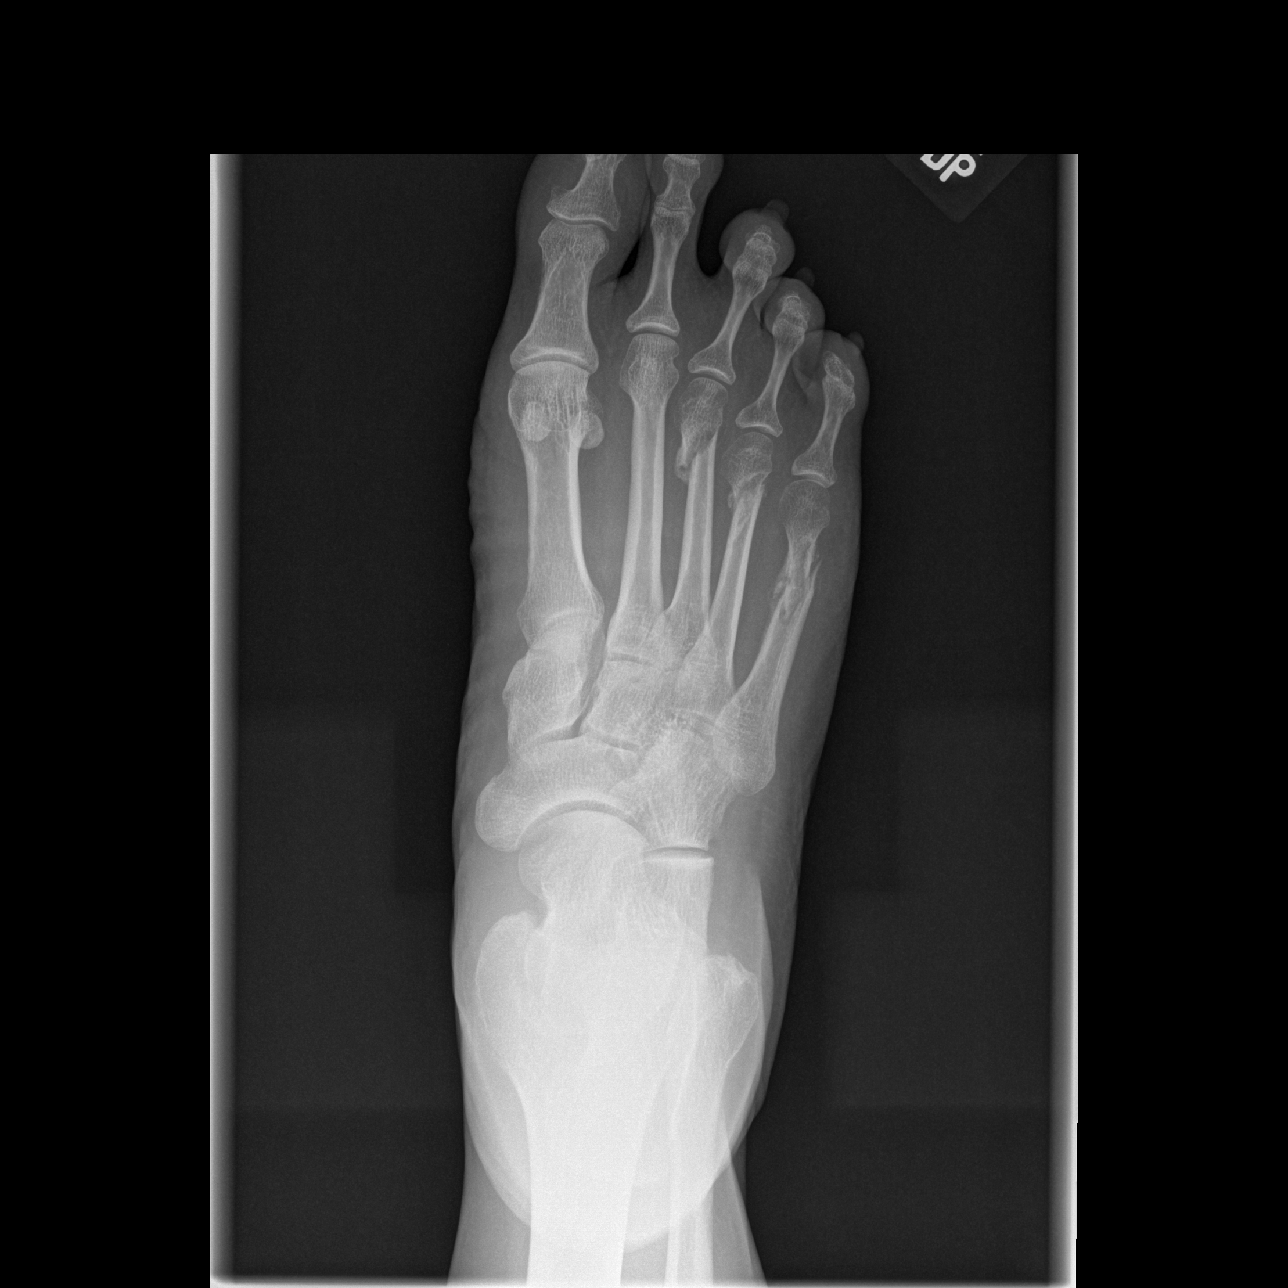

[t foot oblique right]
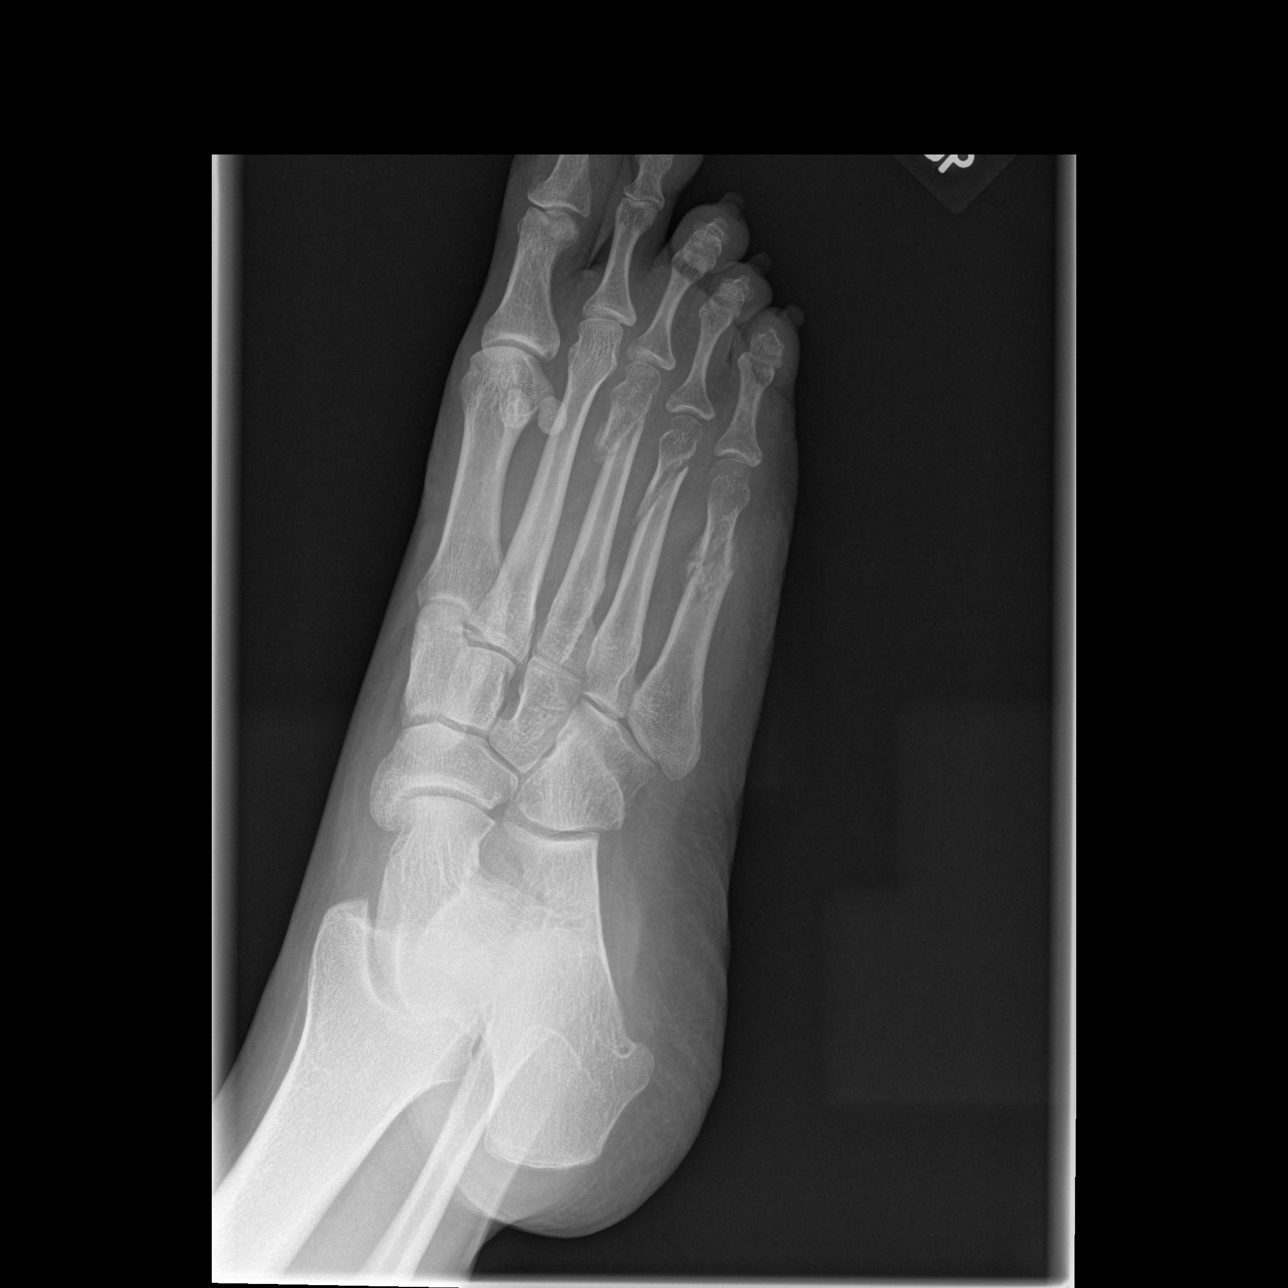

[t foot lat right]
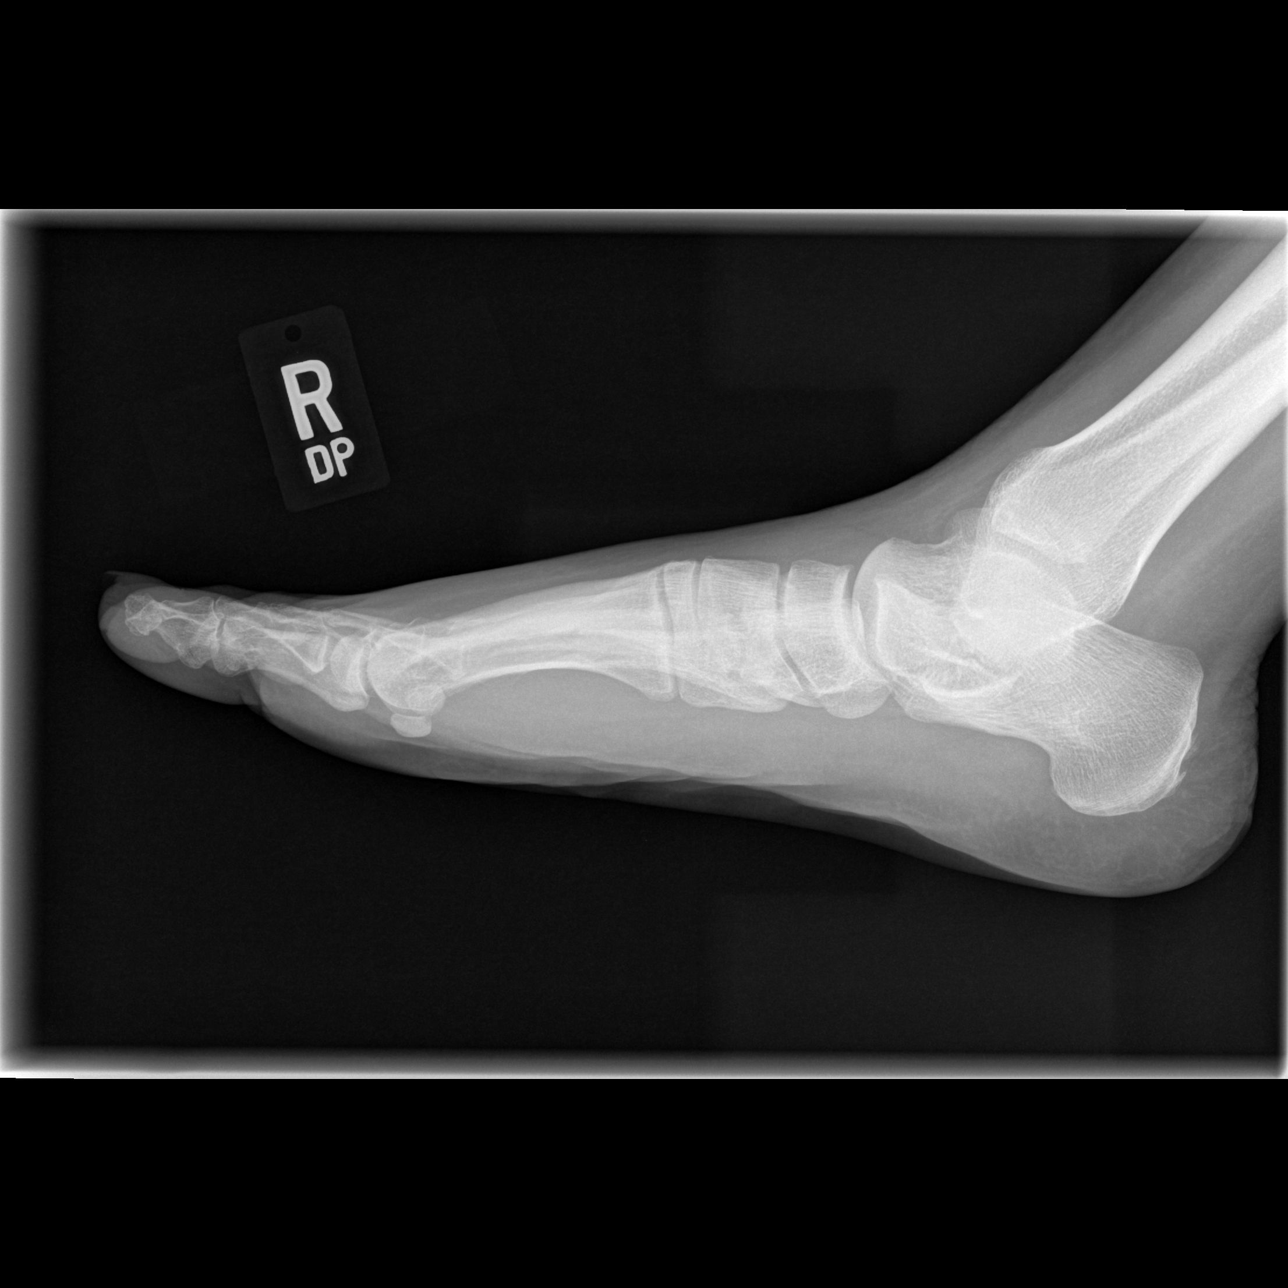

[3 of 3 positions shown; findings below may reference images not displayed]

FINDINGS: Redemonstrated mildly displaced fractures of the third, fourth, and
fifth metatarsals. No significant change in fracture alignment.
There is mild interval callus formation at all 3 fracture sites
consistent with interval healing. Fracture site lucencies persist.
No new acute finding identified.
IMPRESSION: Healing third, fourth, and fifth metatarsal fractures. No
significant change in alignment.

## 2019-01-17 NOTE — Patient Instructions (Signed)
The fractures of your third, fourth, and fifth metatarsals are healing very well as seen on the x-rays -You may stop wearing the cam walker -You may transition into supportive footwear -Do not start running yet but you may do some light walking  We will see you back in 2 to 3 weeks and reevaluate your foot.  At that time if your pain continues to improve we can start a gradual increase in your activity
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
## 2019-02-07 ENCOUNTER — Ambulatory Visit: Payer: 59 | Admitting: Sports Medicine

## 2019-02-14 ENCOUNTER — Ambulatory Visit: Payer: 59 | Admitting: Sports Medicine

## 2019-02-28 ENCOUNTER — Ambulatory Visit: Payer: 59 | Admitting: Sports Medicine

## 2019-05-18 ENCOUNTER — Ambulatory Visit: Payer: 59 | Attending: Internal Medicine

## 2019-05-18 DIAGNOSIS — Z23 Encounter for immunization: Secondary | ICD-10-CM | POA: Insufficient documentation

## 2019-05-18 NOTE — Progress Notes (Signed)
   Covid-19 Vaccination Clinic  Name:  ANAHID ESKELSON    MRN: [id number] DOB: 09/17/1980  05/18/2019  Ms. Jerilynn Som was observed post Covid-19 immunization for 15 minutes without incident. She was provided with Vaccine Information Sheet and instruction to access the V-Safe system.   Ms. Jerilynn Som was instructed to call 911 with any severe reactions post vaccine: Marland Kitchen Difficulty breathing  . Swelling of face and throat  . A fast heartbeat  . A bad rash all over body  . Dizziness and weakness

## 2019-06-18 ENCOUNTER — Ambulatory Visit: Payer: 59

## 2019-06-20 ENCOUNTER — Ambulatory Visit: Payer: 59 | Attending: Internal Medicine

## 2019-06-20 DIAGNOSIS — Z23 Encounter for immunization: Secondary | ICD-10-CM

## 2019-06-20 NOTE — Progress Notes (Signed)
   Covid-19 Vaccination Clinic  Name:  [name]    MRN: [id number] DOB: 1980/09/12  06/20/2019  [name] was observed post Covid-19 immunization for 15 minutes without incident. She was provided with Vaccine Information Sheet and instruction to access the V-Safe system.   [name] was instructed to call 911 with any severe reactions post vaccine: Marland Kitchen Difficulty breathing  . Swelling of face and throat  . A fast heartbeat  . A bad rash all over body  . Dizziness and weakness   Immunizations Administered    Name Date Dose VIS Date Route   Pfizer COVID-19 Vaccine 06/20/2019 10:08 AM 0.3 mL 02/23/2019 Intramuscular   Manufacturer: [employer], [employer]   Lot: [id number]   NDC: [address]-3

## 2020-09-19 ENCOUNTER — Other Ambulatory Visit: Payer: 59

## 2020-09-23 ENCOUNTER — Ambulatory Visit: Payer: 59 | Attending: Internal Medicine

## 2020-09-23 DIAGNOSIS — Z20822 Contact with and (suspected) exposure to covid-19: Secondary | ICD-10-CM

## 2020-09-24 LAB — NOVEL CORONAVIRUS, NAA: SARS-CoV-2, NAA: NOT DETECTED

## 2020-09-24 LAB — SPECIMEN STATUS REPORT

## 2020-09-24 LAB — SARS-COV-2, NAA 2 DAY TAT

## 2021-03-27 LAB — OB RESULTS CONSOLE HEPATITIS B SURFACE ANTIGEN: Hepatitis B Surface Ag: NEGATIVE

## 2021-03-27 LAB — OB RESULTS CONSOLE ABO/RH: RH Type: POSITIVE

## 2021-03-27 LAB — OB RESULTS CONSOLE HIV ANTIBODY (ROUTINE TESTING): HIV: NONREACTIVE

## 2021-03-27 LAB — OB RESULTS CONSOLE RPR: RPR: NONREACTIVE

## 2021-03-27 LAB — OB RESULTS CONSOLE RUBELLA ANTIBODY, IGM: Rubella: IMMUNE

## 2021-03-27 LAB — OB RESULTS CONSOLE GC/CHLAMYDIA
Chlamydia: NEGATIVE
Neisseria Gonorrhea: NEGATIVE

## 2021-03-27 LAB — HEPATITIS C ANTIBODY: HCV Ab: NEGATIVE

## 2021-03-27 LAB — OB RESULTS CONSOLE ANTIBODY SCREEN: Antibody Screen: NEGATIVE

## 2021-06-26 ENCOUNTER — Inpatient Hospital Stay (HOSPITAL_COMMUNITY): Payer: 59

## 2021-06-26 ENCOUNTER — Encounter (HOSPITAL_COMMUNITY): Payer: Self-pay | Admitting: *Deleted

## 2021-06-26 ENCOUNTER — Inpatient Hospital Stay (HOSPITAL_COMMUNITY)
Admission: AD | Admit: 2021-06-26 | Discharge: 2021-07-01 | DRG: 832 | Disposition: A | Discharge status: Home / Self Care | Payer: 59 | Attending: Obstetrics and Gynecology | Admitting: Obstetrics and Gynecology

## 2021-06-26 DIAGNOSIS — O99352 Diseases of the nervous system complicating pregnancy, second trimester: Secondary | ICD-10-CM | POA: Diagnosis not present

## 2021-06-26 DIAGNOSIS — H469 Unspecified optic neuritis: Secondary | ICD-10-CM | POA: Diagnosis not present

## 2021-06-26 DIAGNOSIS — G373 Acute transverse myelitis in demyelinating disease of central nervous system: Secondary | ICD-10-CM | POA: Diagnosis present

## 2021-06-26 DIAGNOSIS — Z3A22 22 weeks gestation of pregnancy: Secondary | ICD-10-CM

## 2021-06-26 DIAGNOSIS — O99842 Bariatric surgery status complicating pregnancy, second trimester: Secondary | ICD-10-CM | POA: Diagnosis present

## 2021-06-26 DIAGNOSIS — O09522 Supervision of elderly multigravida, second trimester: Secondary | ICD-10-CM

## 2021-06-26 LAB — COMPREHENSIVE METABOLIC PANEL
ALT: 14 U/L (ref 0–44)
AST: 16 U/L (ref 15–41)
Albumin: 3.3 g/dL — ABNORMAL LOW (ref 3.5–5.0)
Alkaline Phosphatase: 40 U/L (ref 38–126)
Anion gap: 9 (ref 5–15)
BUN: 14 mg/dL (ref 6–20)
CO2: 23 mmol/L (ref 22–32)
Calcium: 9 mg/dL (ref 8.9–10.3)
Chloride: 104 mmol/L (ref 98–111)
Creatinine, Ser: 0.49 mg/dL (ref 0.44–1.00)
GFR, Estimated: >60 mL/min (ref >60)
Glucose, Bld: 95 mg/dL (ref 70–99)
Potassium: 3.7 mmol/L (ref 3.5–5.1)
Sodium: 136 mmol/L (ref 135–145)
Total Bilirubin: 0.4 mg/dL (ref 0.3–1.2)
Total Protein: 6.9 g/dL (ref 6.5–8.1)

## 2021-06-26 LAB — CBC WITH DIFFERENTIAL/PLATELET
Abs Immature Granulocytes: 0.06 10*3/uL (ref 0.00–0.07)
Basophils Absolute: 0 10*3/uL (ref 0.0–0.1)
Basophils Relative: 0 %
Eosinophils Absolute: 0.1 10*3/uL (ref 0.0–0.5)
Eosinophils Relative: 2 %
HCT: 33 % — ABNORMAL LOW (ref 36.0–46.0)
Hemoglobin: 11.2 g/dL — ABNORMAL LOW (ref 12.0–15.0)
Immature Granulocytes: 1 %
Lymphocytes Relative: 23 %
Lymphs Abs: 1.6 10*3/uL (ref 0.7–4.0)
MCH: 30.8 pg (ref 26.0–34.0)
MCHC: 33.9 g/dL (ref 30.0–36.0)
MCV: 90.7 fL (ref 80.0–100.0)
Monocytes Absolute: 0.6 10*3/uL (ref 0.1–1.0)
Monocytes Relative: 8 %
Neutro Abs: 4.6 10*3/uL (ref 1.7–7.7)
Neutrophils Relative %: 66 %
Platelets: 318 10*3/uL (ref 150–400)
RBC: 3.64 MIL/uL — ABNORMAL LOW (ref 3.87–5.11)
RDW: 11.9 % (ref 11.5–15.5)
WBC: 7 10*3/uL (ref 4.0–10.5)
nRBC: 0 % (ref 0.0–0.2)

## 2021-06-26 LAB — DIC (DISSEMINATED INTRAVASCULAR COAGULATION)PANEL
D-Dimer, Quant: 0.87 ug/mL-FEU — ABNORMAL HIGH (ref 0.00–0.50)
Fibrinogen: 515 mg/dL — ABNORMAL HIGH (ref 210–475)
INR: 1 (ref 0.8–1.2)
Platelets: 311 10*3/uL (ref 150–400)
Prothrombin Time: 12.9 seconds (ref 11.4–15.2)
Smear Review: NONE SEEN
aPTT: 28 seconds (ref 24–36)

## 2021-06-26 IMAGING — MR MR HEAD W/O CM
9 of 13 series · 29 of 48 positions shown · non-contrast
Comparison: None available.
COMPARISON: None available.

Addendum:
CLINICAL DATA: Initial evaluation for acute myelopathy, lower
extremity weakness, 22 weeks pregnant.

EXAM:
MRI HEAD WITHOUT CONTRAST
MRI CERVICAL SPINE WITHOUT CONTRAST
MRI THORACIC SPINE WITHOUT CONTRAST
TECHNIQUE: Multiplanar, multiecho pulse sequences of the brain and surrounding
structures, and cervical spine, and thoracic spine to include the
craniocervical junction and cervicothoracic junction, were obtained
without intravenous contrast.

[Series 9: DWI · axial · 3.0mm · 0.88mm/px · z∈[-174,-44]mm · 8 of 100 slices shown (1 of 4)]
[im 1/100]
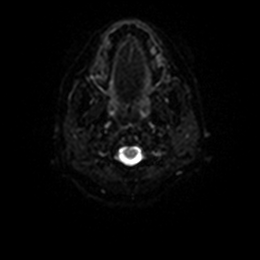
[im 15/100]
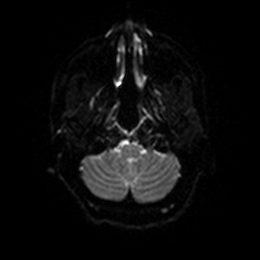
[im 29/100]
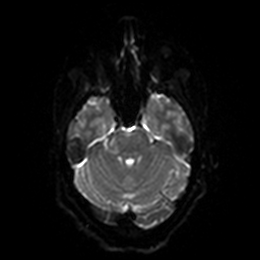
[im 43/100]
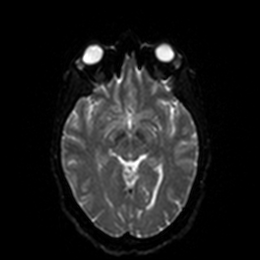
[im 57/100]
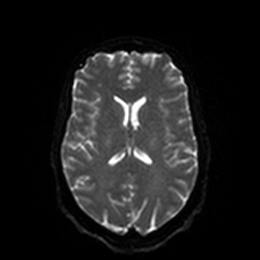
[im 71/100]
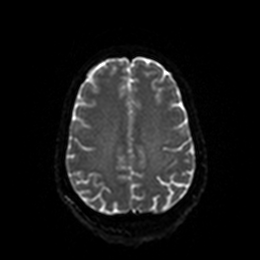
[im 85/100]
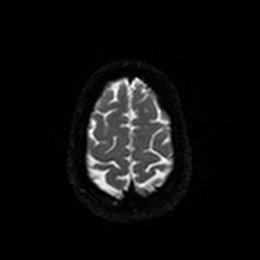
[im 100/100]
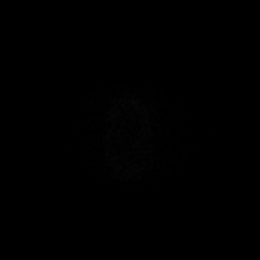

[Series 10: DWI · axial · 3.0mm · 0.88mm/px · z∈[-174,-44]mm · 4 of 50 slices shown (2 of 4)]
[im 1/50]
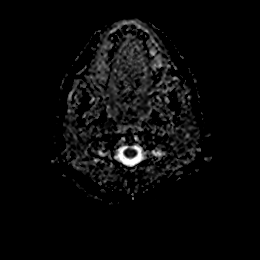
[im 17/50]
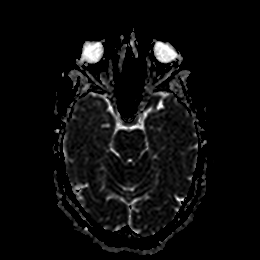
[im 33/50]
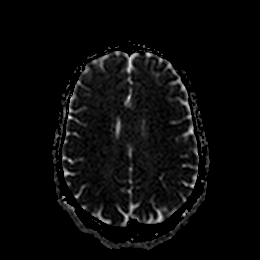
[im 50/50]
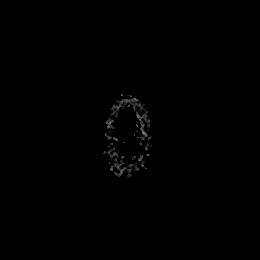

[Series 11: DWI · coronal · 4.0mm · 0.88mm/px · 5 of 72 slices shown (3 of 4)]
[im 1/72]
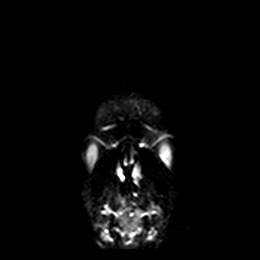
[im 18/72]
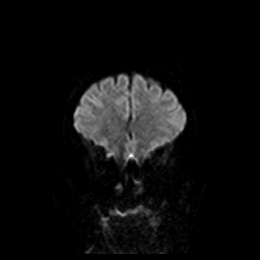
[im 36/72]
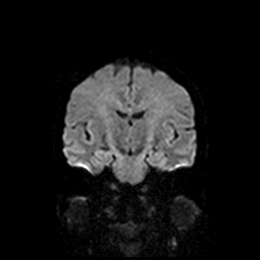
[im 54/72]
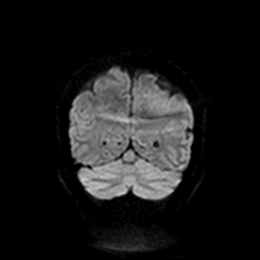
[im 72/72]
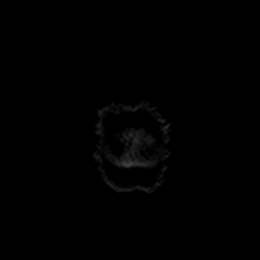

[Series 12: DWI · coronal · 4.0mm · 0.88mm/px · 3 of 36 slices shown (4 of 4)]
[im 1/36]
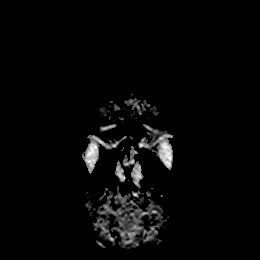
[im 18/36]
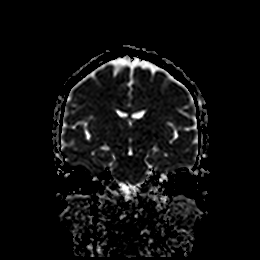
[im 36/36]
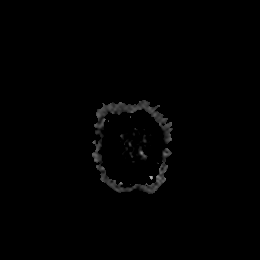

[Series 13: T1 · sagittal · 5.0mm · 0.75mm/px · 2 of 24 slices shown]
[im 1/24]
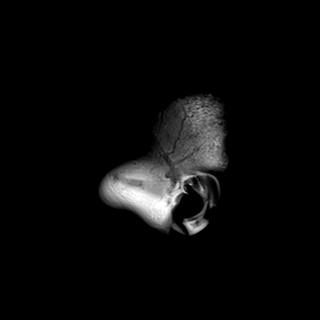
[im 24/24]
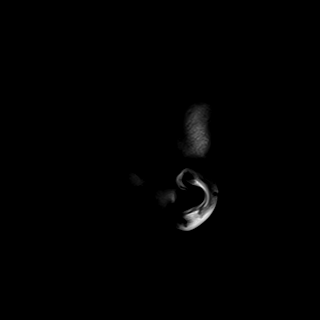

[Series 14: T2 · axial · 5.0mm · 0.72mm/px · z∈[-177,-45]mm · 2 of 26 slices shown (1 of 2)]
[im 1/26]
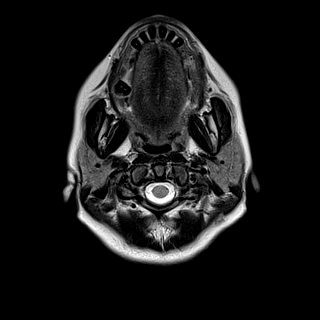
[im 26/26]
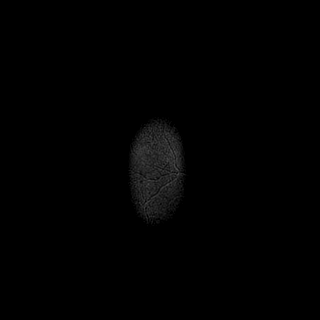

[Series 15: FLAIR · axial · 5.0mm · 0.45mm/px · z∈[-176,-44]mm · 2 of 26 slices shown]
[im 1/26]
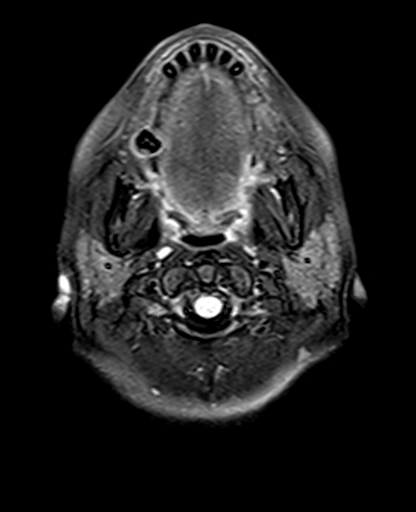
[im 26/26]
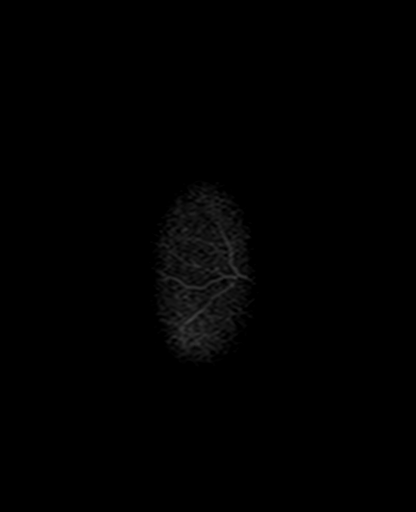

[Series 16: mag_images · axial · 3.0mm · 0.90mm/px · 1 of 60 slices shown]
[im 1/60]
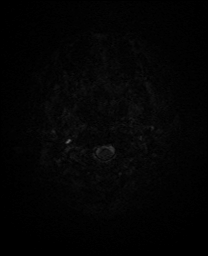

[Series 21: T2 · coronal · 5.0mm · 0.34mm/px · 2 of 30 slices shown (2 of 2)]
[im 1/30]
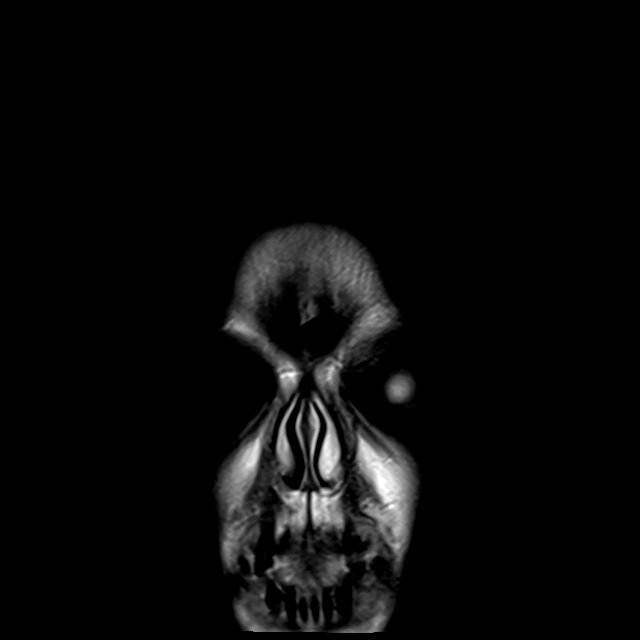
[im 30/30]
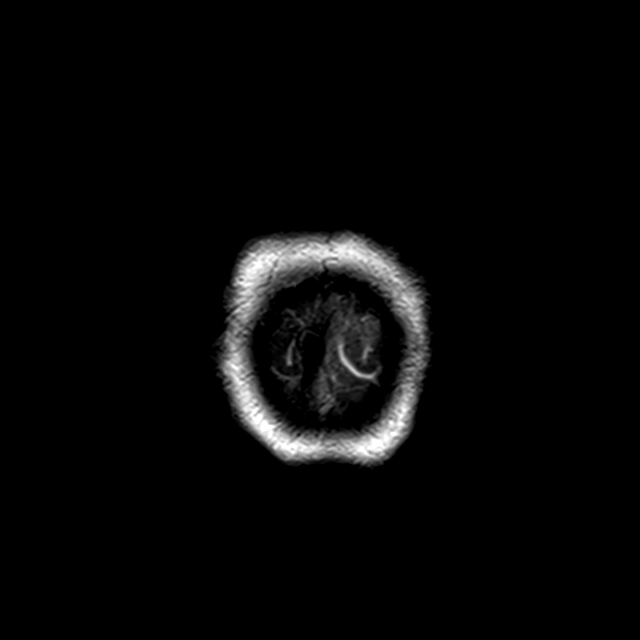

[29 of 48 positions shown; findings below may reference images not displayed]

FINDINGS: MRI HEAD FINDINGS

Brain: Cerebral volume within normal limits. There is a single
abnormal focus of T2/FLAIR signal abnormality involving the right
middle cerebellar peduncle, measuring approximately 1.1 x 1.5 cm
(series 15, image 8). Superior extension into the right lateral pons
(series 15, image 9). Incomplete brain of FLAIR signal abnormality
seen about a portion of this lesion (series 15, image 8). Finding is
indeterminate, but suspected to reflect a focal area of
demyelination. No significant regional mass effect. No associated
susceptibility artifact to suggest hemorrhage. No associated
diffusion signal abnormality.

There is a single additional punctate 3 mm focus of FLAIR
hyperintensity involving the subcortical white matter of the
anterior right frontal lobe (series 15, image 15). Otherwise no
other significant cerebral white matter disease or visible focal
parenchymal signal abnormality.

No evidence for acute or subacute infarct. Gray-white matter
differentiation maintained. No areas of chronic cortical infarction
or other insult. No acute or chronic intracranial blood products.

No other mass lesion, midline shift or mass effect. No hydrocephalus
or extra-axial fluid collection. Pituitary gland suprasellar region
normal. Midline structures intact and normal.

Vascular: Major intracranial vascular flow voids are well
maintained.

Skull and upper cervical spine: Craniocervical junction within
normal limits. Diffusely decreased T1 signal intensity seen
throughout the visualized bone marrow, nonspecific, but could be
related current pregnancy. No scalp soft tissue abnormality.

Sinuses/Orbits: Globes and orbital soft tissues within normal
limits. Paranasal sinuses are largely clear. No mastoid effusion.
Inner ear structures grossly normal.

Other: None.

MRI CERVICAL SPINE FINDINGS

Alignment: Straightening with smooth reversal of the normal cervical
lordosis. No significant listhesis.

Vertebrae: Vertebral body height maintained without acute or chronic
fracture. Diffusely decreased T1 signal intensity seen throughout
the visualized bone marrow, suspected to be related to current
pregnancy. No discrete or worrisome osseous lesions or abnormal
marrow edema.

Cord: Normal signal morphology. No cord signal changes to suggest
demyelinating disease.

Posterior Fossa, vertebral arteries, paraspinal tissues:
Unremarkable.

Disc levels:

C2-C3: Unremarkable.

C3-C4:  Unremarkable.

C4-C5: Small right paracentral disc protrusion mildly indents the
ventral thecal sac. No significant spinal stenosis. Foramina remain
patent.

C5-C6: Mild disc bulge with uncovertebral spurring. No significant
spinal stenosis. Foramina remain patent.

C6-C7:  Unremarkable.

C7-T1:  Unremarkable.

MRI THORACIC SPINE FINDINGS

Alignment: Physiologic with preservation of the normal thoracic
kyphosis. No listhesis.

Vertebrae: Vertebral body height maintained without acute or chronic
fracture. Diffusely decreased T1 signal intensity throughout the
visualized bone marrow. Subcentimeter benign hemangioma noted within
the T10 vertebral body. No worrisome osseous lesions. No abnormal
marrow edema.

Cord: Normal signal and morphology.

Paraspinal soft tissues: Unremarkable.

Disc levels: Unremarkable. No significant disc pathology. No
stenosis or neural impingement.
IMPRESSION: MRI HEAD IMPRESSION:

1. 1.5 cm focus of abnormal T2/FLAIR signal abnormality involving
the right middle cerebellar peduncle extending into the right
lateral pons. Finding is indeterminate, but favored to reflect a
focal area of demyelination. Possible changes of subacute ischemia
or mass/glioma would be the primary differential considerations,
although felt to be less likely. No significant mass effect.
2. Single additional punctate 3 mm focus of FLAIR hyperintensity
involving the subcortical right frontal lobe, indeterminate.
3. Otherwise normal brain MRI.

MRI CERVICAL AND THORACIC SPINE IMPRESSION:

1. Normal MRI appearance of the cervical and thoracic spinal cord.
No evidence for demyelinating disease.
2. Mild degenerative spondylosis at C4-5 and C5-6 without
significant stenosis or neural impingement.

ADDENDUM:
Since time of initial dictation, some additional history of acute
right-sided visual disturbance has been provided. Upon review of the
initial brain MRI, note is also made of subtle asymmetric
enlargement and heterogeneity of the right optic nerve with fuzzy
signal changes near its insertion at the posterior right lobe/optic
disc (series 15, image 10). Subtle associated asymmetric diffusion
signal noted as well. Findings highly suspicious for acute optic
neuritis. Follow-up examination with dedicated MRI of the orbits,
with and without contrast, could be performed for confirmation as
warranted.

Addended findings discussed by telephone with Dr. ROBERT SILVA at
approximately [DATE] on [DATE].

*** End of Addendum ***
FINDINGS: MRI HEAD FINDINGS

Brain: Cerebral volume within normal limits. There is a single
abnormal focus of T2/FLAIR signal abnormality involving the right
middle cerebellar peduncle, measuring approximately 1.1 x 1.5 cm
(series 15, image 8). Superior extension into the right lateral pons
(series 15, image 9). Incomplete brain of FLAIR signal abnormality
seen about a portion of this lesion (series 15, image 8). Finding is
indeterminate, but suspected to reflect a focal area of
demyelination. No significant regional mass effect. No associated
susceptibility artifact to suggest hemorrhage. No associated
diffusion signal abnormality.

There is a single additional punctate 3 mm focus of FLAIR
hyperintensity involving the subcortical white matter of the
anterior right frontal lobe (series 15, image 15). Otherwise no
other significant cerebral white matter disease or visible focal
parenchymal signal abnormality.

No evidence for acute or subacute infarct. Gray-white matter
differentiation maintained. No areas of chronic cortical infarction
or other insult. No acute or chronic intracranial blood products.

No other mass lesion, midline shift or mass effect. No hydrocephalus
or extra-axial fluid collection. Pituitary gland suprasellar region
normal. Midline structures intact and normal.

Vascular: Major intracranial vascular flow voids are well
maintained.

Skull and upper cervical spine: Craniocervical junction within
normal limits. Diffusely decreased T1 signal intensity seen
throughout the visualized bone marrow, nonspecific, but could be
related current pregnancy. No scalp soft tissue abnormality.

Sinuses/Orbits: Globes and orbital soft tissues within normal
limits. Paranasal sinuses are largely clear. No mastoid effusion.
Inner ear structures grossly normal.

Other: None.

MRI CERVICAL SPINE FINDINGS

Alignment: Straightening with smooth reversal of the normal cervical
lordosis. No significant listhesis.

Vertebrae: Vertebral body height maintained without acute or chronic
fracture. Diffusely decreased T1 signal intensity seen throughout
the visualized bone marrow, suspected to be related to current
pregnancy. No discrete or worrisome osseous lesions or abnormal
marrow edema.

Cord: Normal signal morphology. No cord signal changes to suggest
demyelinating disease.

Posterior Fossa, vertebral arteries, paraspinal tissues:
Unremarkable.

Disc levels:

C2-C3: Unremarkable.

C3-C4:  Unremarkable.

C4-C5: Small right paracentral disc protrusion mildly indents the
ventral thecal sac. No significant spinal stenosis. Foramina remain
patent.

C5-C6: Mild disc bulge with uncovertebral spurring. No significant
spinal stenosis. Foramina remain patent.

C6-C7:  Unremarkable.

C7-T1:  Unremarkable.

MRI THORACIC SPINE FINDINGS

Alignment: Physiologic with preservation of the normal thoracic
kyphosis. No listhesis.

Vertebrae: Vertebral body height maintained without acute or chronic
fracture. Diffusely decreased T1 signal intensity throughout the
visualized bone marrow. Subcentimeter benign hemangioma noted within
the T10 vertebral body. No worrisome osseous lesions. No abnormal
marrow edema.

Cord: Normal signal and morphology.

Paraspinal soft tissues: Unremarkable.

Disc levels: Unremarkable. No significant disc pathology. No
stenosis or neural impingement.
IMPRESSION: MRI HEAD IMPRESSION:

1. 1.5 cm focus of abnormal T2/FLAIR signal abnormality involving
the right middle cerebellar peduncle extending into the right
lateral pons. Finding is indeterminate, but favored to reflect a
focal area of demyelination. Possible changes of subacute ischemia
or mass/glioma would be the primary differential considerations,
although felt to be less likely. No significant mass effect.
2. Single additional punctate 3 mm focus of FLAIR hyperintensity
involving the subcortical right frontal lobe, indeterminate.
3. Otherwise normal brain MRI.

MRI CERVICAL AND THORACIC SPINE IMPRESSION:

1. Normal MRI appearance of the cervical and thoracic spinal cord.
No evidence for demyelinating disease.
2. Mild degenerative spondylosis at C4-5 and C5-6 without
significant stenosis or neural impingement.

## 2021-06-26 IMAGING — MR MR CERVICAL SPINE W/O CM
5 series · 29 of 48 positions shown · non-contrast
Comparison: None available.
COMPARISON: None available.

Addendum:
CLINICAL DATA: Initial evaluation for acute myelopathy, lower
extremity weakness, 22 weeks pregnant.

EXAM:
MRI HEAD WITHOUT CONTRAST
MRI CERVICAL SPINE WITHOUT CONTRAST
MRI THORACIC SPINE WITHOUT CONTRAST
TECHNIQUE: Multiplanar, multiecho pulse sequences of the brain and surrounding
structures, and cervical spine, and thoracic spine to include the
craniocervical junction and cervicothoracic junction, were obtained
without intravenous contrast.

[Series 13: T2 · sagittal · 3.0mm · 0.69mm/px · 6 of 15 slices shown (1 of 2)]
[im 1/15]
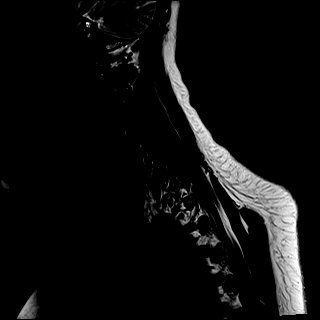
[im 3/15]
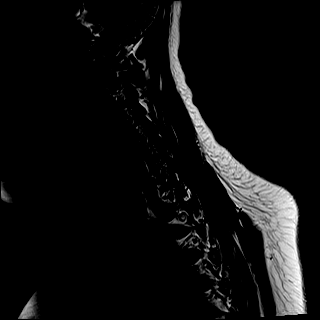
[im 6/15]
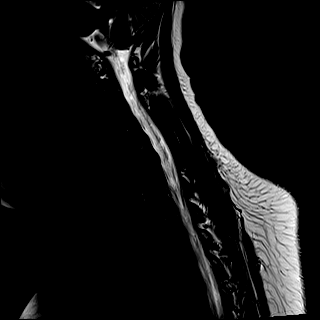
[im 9/15]
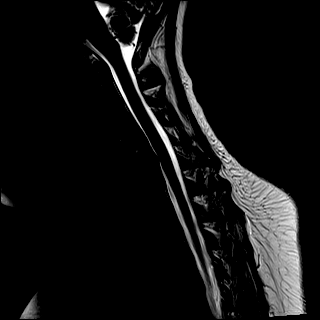
[im 12/15]
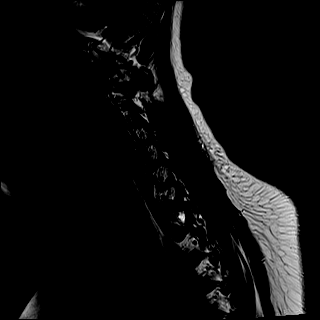
[im 15/15]
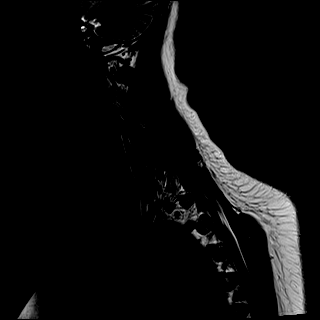

[Series 14: T1 · sagittal · 3.0mm · 0.69mm/px · 6 of 15 slices shown]
[im 1/15]
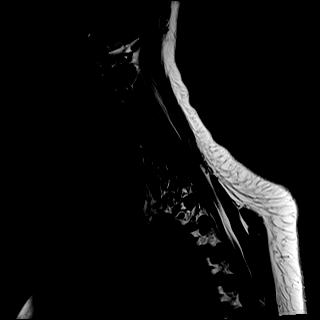
[im 3/15]
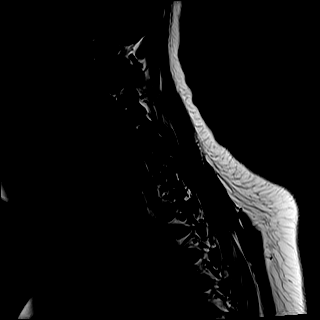
[im 6/15]
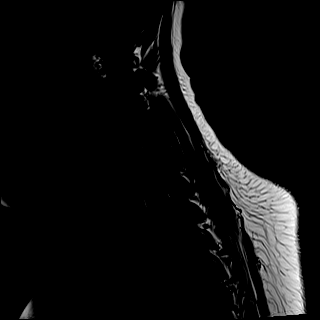
[im 9/15]
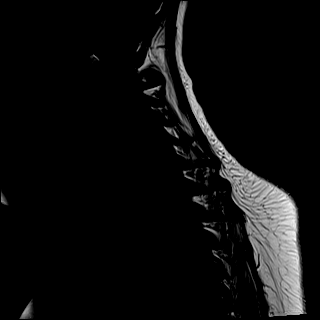
[im 12/15]
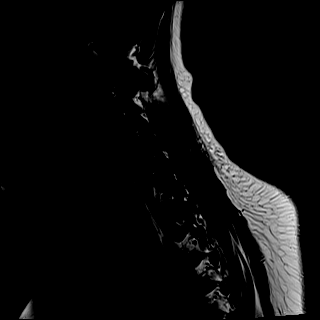
[im 15/15]
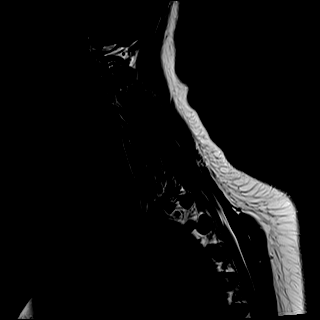

[Series 15: STIR · sagittal · 3.0mm · 0.86mm/px · 6 of 15 slices shown]
[im 1/15]
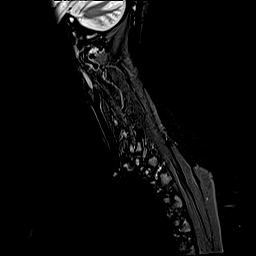
[im 3/15]
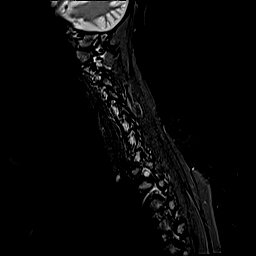
[im 6/15]
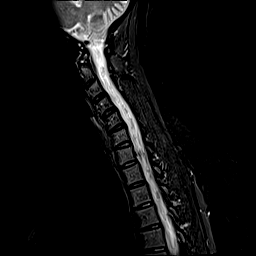
[im 9/15]
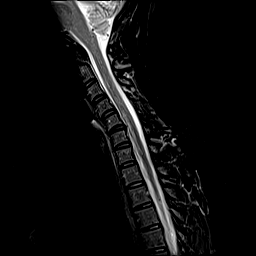
[im 12/15]
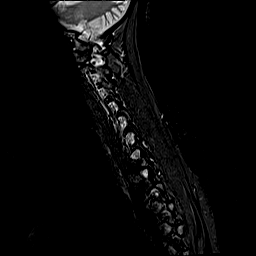
[im 15/15]
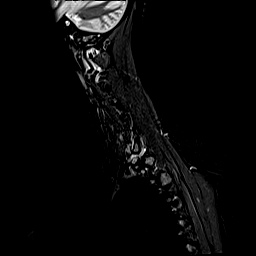

[Series 16: T2 · axial · 3.0mm · 0.66mm/px · z∈[-253,-132]mm · 9 of 40 slices shown (2 of 2)]
[im 1/40]
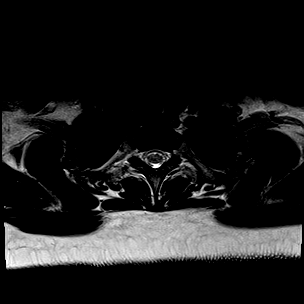
[im 6/40]
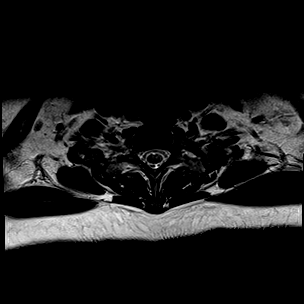
[im 12/40]
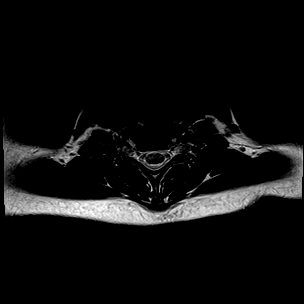
[im 17/40]
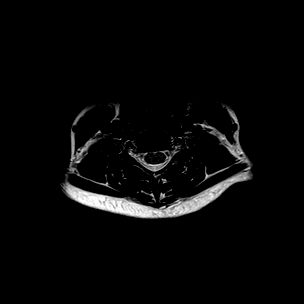
[im 20/40]
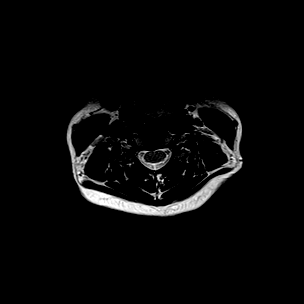
[im 23/40]
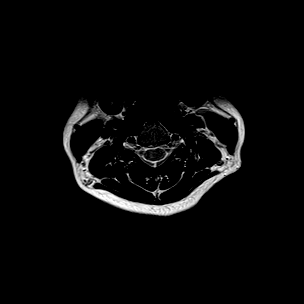
[im 28/40]
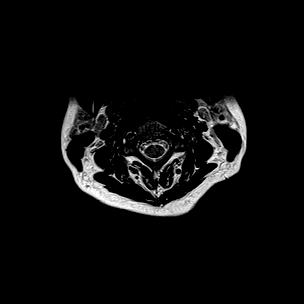
[im 34/40]
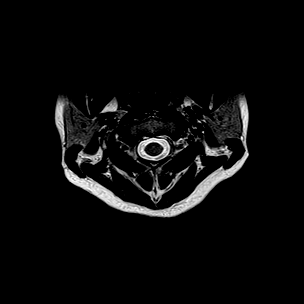
[im 40/40]
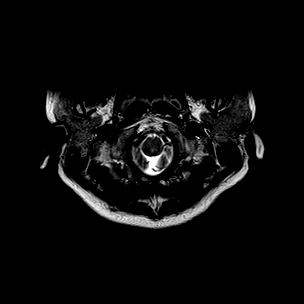

[Series 17: GRE · axial · 3.0mm · 0.39mm/px · z∈[-253,-237]mm · 2 of 40 slices shown]
[im 1/40]
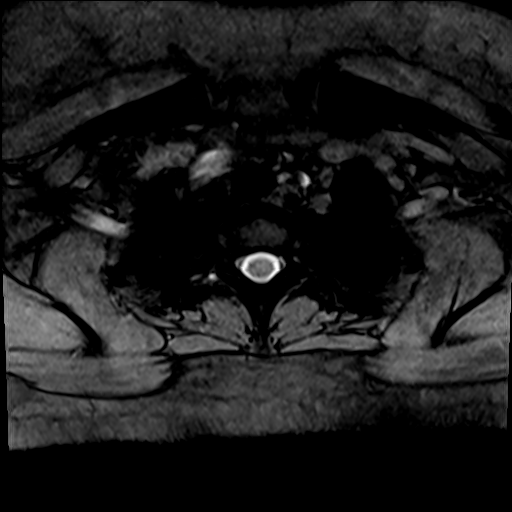
[im 6/40]
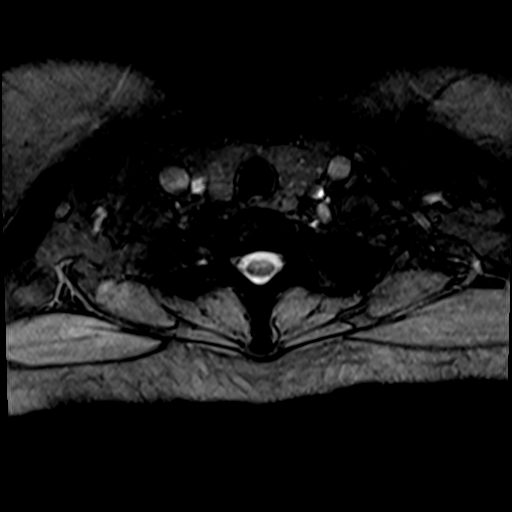

[29 of 48 positions shown; findings below may reference images not displayed]

FINDINGS: MRI HEAD FINDINGS

Brain: Cerebral volume within normal limits. There is a single
abnormal focus of T2/FLAIR signal abnormality involving the right
middle cerebellar peduncle, measuring approximately 1.1 x 1.5 cm
(series 15, image 8). Superior extension into the right lateral pons
(series 15, image 9). Incomplete brain of FLAIR signal abnormality
seen about a portion of this lesion (series 15, image 8). Finding is
indeterminate, but suspected to reflect a focal area of
demyelination. No significant regional mass effect. No associated
susceptibility artifact to suggest hemorrhage. No associated
diffusion signal abnormality.

There is a single additional punctate 3 mm focus of FLAIR
hyperintensity involving the subcortical white matter of the
anterior right frontal lobe (series 15, image 15). Otherwise no
other significant cerebral white matter disease or visible focal
parenchymal signal abnormality.

No evidence for acute or subacute infarct. Gray-white matter
differentiation maintained. No areas of chronic cortical infarction
or other insult. No acute or chronic intracranial blood products.

No other mass lesion, midline shift or mass effect. No hydrocephalus
or extra-axial fluid collection. Pituitary gland suprasellar region
normal. Midline structures intact and normal.

Vascular: Major intracranial vascular flow voids are well
maintained.

Skull and upper cervical spine: Craniocervical junction within
normal limits. Diffusely decreased T1 signal intensity seen
throughout the visualized bone marrow, nonspecific, but could be
related current pregnancy. No scalp soft tissue abnormality.

Sinuses/Orbits: Globes and orbital soft tissues within normal
limits. Paranasal sinuses are largely clear. No mastoid effusion.
Inner ear structures grossly normal.

Other: None.

MRI CERVICAL SPINE FINDINGS

Alignment: Straightening with smooth reversal of the normal cervical
lordosis. No significant listhesis.

Vertebrae: Vertebral body height maintained without acute or chronic
fracture. Diffusely decreased T1 signal intensity seen throughout
the visualized bone marrow, suspected to be related to current
pregnancy. No discrete or worrisome osseous lesions or abnormal
marrow edema.

Cord: Normal signal morphology. No cord signal changes to suggest
demyelinating disease.

Posterior Fossa, vertebral arteries, paraspinal tissues:
Unremarkable.

Disc levels:

C2-C3: Unremarkable.

C3-C4:  Unremarkable.

C4-C5: Small right paracentral disc protrusion mildly indents the
ventral thecal sac. No significant spinal stenosis. Foramina remain
patent.

C5-C6: Mild disc bulge with uncovertebral spurring. No significant
spinal stenosis. Foramina remain patent.

C6-C7:  Unremarkable.

C7-T1:  Unremarkable.

MRI THORACIC SPINE FINDINGS

Alignment: Physiologic with preservation of the normal thoracic
kyphosis. No listhesis.

Vertebrae: Vertebral body height maintained without acute or chronic
fracture. Diffusely decreased T1 signal intensity throughout the
visualized bone marrow. Subcentimeter benign hemangioma noted within
the T10 vertebral body. No worrisome osseous lesions. No abnormal
marrow edema.

Cord: Normal signal and morphology.

Paraspinal soft tissues: Unremarkable.

Disc levels: Unremarkable. No significant disc pathology. No
stenosis or neural impingement.
IMPRESSION: MRI HEAD IMPRESSION:

1. 1.5 cm focus of abnormal T2/FLAIR signal abnormality involving
the right middle cerebellar peduncle extending into the right
lateral pons. Finding is indeterminate, but favored to reflect a
focal area of demyelination. Possible changes of subacute ischemia
or mass/glioma would be the primary differential considerations,
although felt to be less likely. No significant mass effect.
2. Single additional punctate 3 mm focus of FLAIR hyperintensity
involving the subcortical right frontal lobe, indeterminate.
3. Otherwise normal brain MRI.

MRI CERVICAL AND THORACIC SPINE IMPRESSION:

1. Normal MRI appearance of the cervical and thoracic spinal cord.
No evidence for demyelinating disease.
2. Mild degenerative spondylosis at C4-5 and C5-6 without
significant stenosis or neural impingement.

ADDENDUM:
Since time of initial dictation, some additional history of acute
right-sided visual disturbance has been provided. Upon review of the
initial brain MRI, note is also made of subtle asymmetric
enlargement and heterogeneity of the right optic nerve with fuzzy
signal changes near its insertion at the posterior right lobe/optic
disc (series 15, image 10). Subtle associated asymmetric diffusion
signal noted as well. Findings highly suspicious for acute optic
neuritis. Follow-up examination with dedicated MRI of the orbits,
with and without contrast, could be performed for confirmation as
warranted.

Addended findings discussed by telephone with Dr. ROBERT SILVA at
approximately [DATE] on [DATE].

*** End of Addendum ***
FINDINGS: MRI HEAD FINDINGS

Brain: Cerebral volume within normal limits. There is a single
abnormal focus of T2/FLAIR signal abnormality involving the right
middle cerebellar peduncle, measuring approximately 1.1 x 1.5 cm
(series 15, image 8). Superior extension into the right lateral pons
(series 15, image 9). Incomplete brain of FLAIR signal abnormality
seen about a portion of this lesion (series 15, image 8). Finding is
indeterminate, but suspected to reflect a focal area of
demyelination. No significant regional mass effect. No associated
susceptibility artifact to suggest hemorrhage. No associated
diffusion signal abnormality.

There is a single additional punctate 3 mm focus of FLAIR
hyperintensity involving the subcortical white matter of the
anterior right frontal lobe (series 15, image 15). Otherwise no
other significant cerebral white matter disease or visible focal
parenchymal signal abnormality.

No evidence for acute or subacute infarct. Gray-white matter
differentiation maintained. No areas of chronic cortical infarction
or other insult. No acute or chronic intracranial blood products.

No other mass lesion, midline shift or mass effect. No hydrocephalus
or extra-axial fluid collection. Pituitary gland suprasellar region
normal. Midline structures intact and normal.

Vascular: Major intracranial vascular flow voids are well
maintained.

Skull and upper cervical spine: Craniocervical junction within
normal limits. Diffusely decreased T1 signal intensity seen
throughout the visualized bone marrow, nonspecific, but could be
related current pregnancy. No scalp soft tissue abnormality.

Sinuses/Orbits: Globes and orbital soft tissues within normal
limits. Paranasal sinuses are largely clear. No mastoid effusion.
Inner ear structures grossly normal.

Other: None.

MRI CERVICAL SPINE FINDINGS

Alignment: Straightening with smooth reversal of the normal cervical
lordosis. No significant listhesis.

Vertebrae: Vertebral body height maintained without acute or chronic
fracture. Diffusely decreased T1 signal intensity seen throughout
the visualized bone marrow, suspected to be related to current
pregnancy. No discrete or worrisome osseous lesions or abnormal
marrow edema.

Cord: Normal signal morphology. No cord signal changes to suggest
demyelinating disease.

Posterior Fossa, vertebral arteries, paraspinal tissues:
Unremarkable.

Disc levels:

C2-C3: Unremarkable.

C3-C4:  Unremarkable.

C4-C5: Small right paracentral disc protrusion mildly indents the
ventral thecal sac. No significant spinal stenosis. Foramina remain
patent.

C5-C6: Mild disc bulge with uncovertebral spurring. No significant
spinal stenosis. Foramina remain patent.

C6-C7:  Unremarkable.

C7-T1:  Unremarkable.

MRI THORACIC SPINE FINDINGS

Alignment: Physiologic with preservation of the normal thoracic
kyphosis. No listhesis.

Vertebrae: Vertebral body height maintained without acute or chronic
fracture. Diffusely decreased T1 signal intensity throughout the
visualized bone marrow. Subcentimeter benign hemangioma noted within
the T10 vertebral body. No worrisome osseous lesions. No abnormal
marrow edema.

Cord: Normal signal and morphology.

Paraspinal soft tissues: Unremarkable.

Disc levels: Unremarkable. No significant disc pathology. No
stenosis or neural impingement.
IMPRESSION: MRI HEAD IMPRESSION:

1. 1.5 cm focus of abnormal T2/FLAIR signal abnormality involving
the right middle cerebellar peduncle extending into the right
lateral pons. Finding is indeterminate, but favored to reflect a
focal area of demyelination. Possible changes of subacute ischemia
or mass/glioma would be the primary differential considerations,
although felt to be less likely. No significant mass effect.
2. Single additional punctate 3 mm focus of FLAIR hyperintensity
involving the subcortical right frontal lobe, indeterminate.
3. Otherwise normal brain MRI.

MRI CERVICAL AND THORACIC SPINE IMPRESSION:

1. Normal MRI appearance of the cervical and thoracic spinal cord.
No evidence for demyelinating disease.
2. Mild degenerative spondylosis at C4-5 and C5-6 without
significant stenosis or neural impingement.

## 2021-06-26 IMAGING — MR MR THORACIC SPINE W/O CM
4 of 6 series · 21 of 48 positions shown · non-contrast
Comparison: None available.
COMPARISON: None available.

Addendum:
CLINICAL DATA: Initial evaluation for acute myelopathy, lower
extremity weakness, 22 weeks pregnant.

EXAM:
MRI HEAD WITHOUT CONTRAST
MRI CERVICAL SPINE WITHOUT CONTRAST
MRI THORACIC SPINE WITHOUT CONTRAST
TECHNIQUE: Multiplanar, multiecho pulse sequences of the brain and surrounding
structures, and cervical spine, and thoracic spine to include the
craniocervical junction and cervicothoracic junction, were obtained
without intravenous contrast.

[Series 22: T1 · sagittal · 6.0mm · 1.23mm/px · 2 of 9 slices shown (1 of 2)]
[im 1/9]
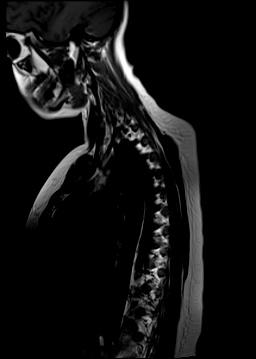
[im 9/9]
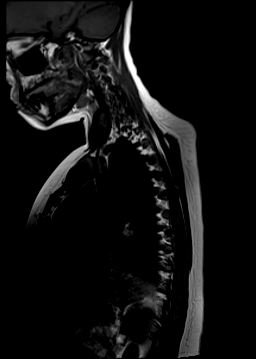

[Series 23: T2 · sagittal · 3.0mm · 0.76mm/px · 6 of 17 slices shown (1 of 2)]
[im 1/17]
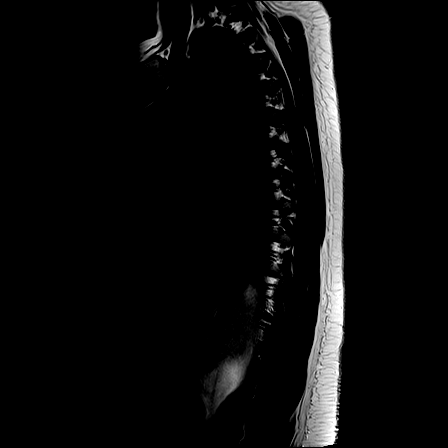
[im 4/17]
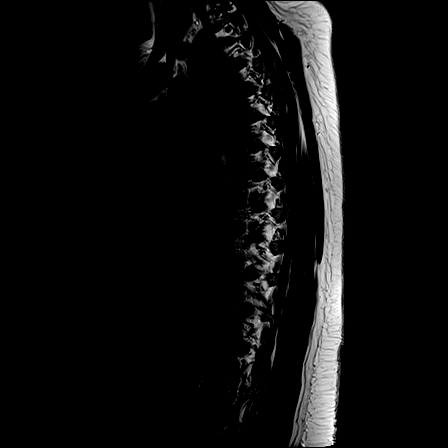
[im 7/17]
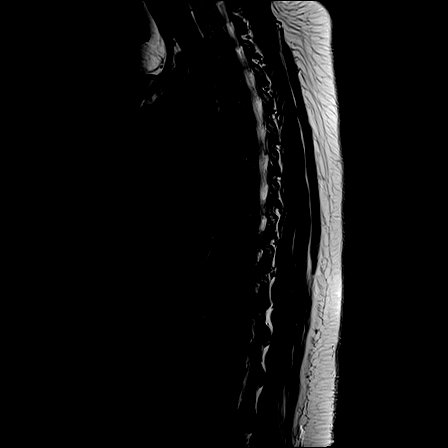
[im 10/17]
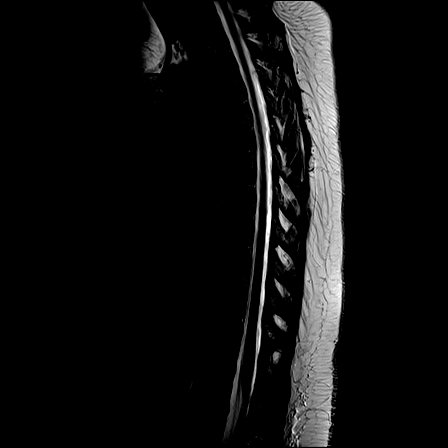
[im 13/17]
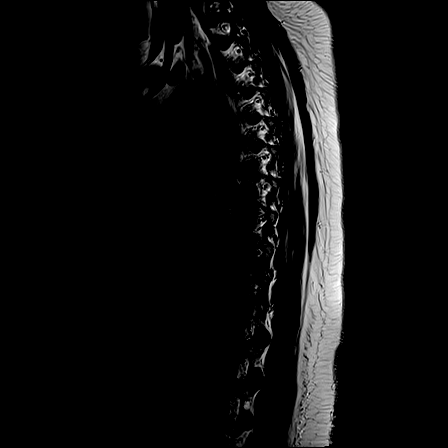
[im 17/17]
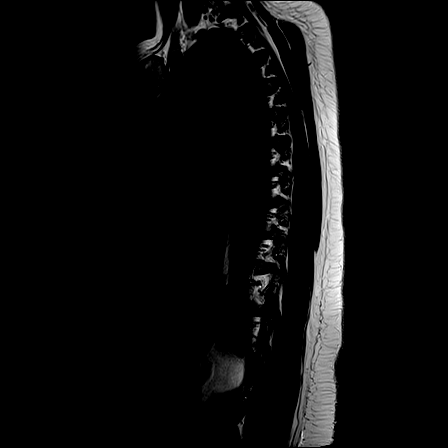

[Series 24: T1 · sagittal · 3.0mm · 0.76mm/px · 5 of 17 slices shown (2 of 2)]
[im 1/17]
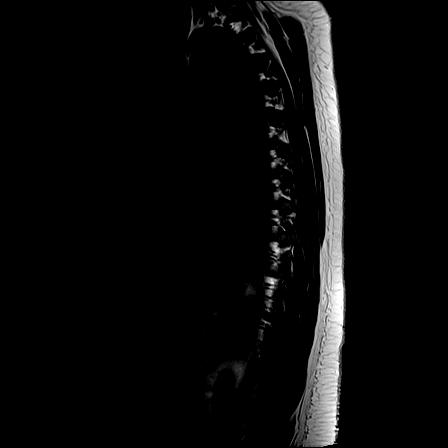
[im 4/17]
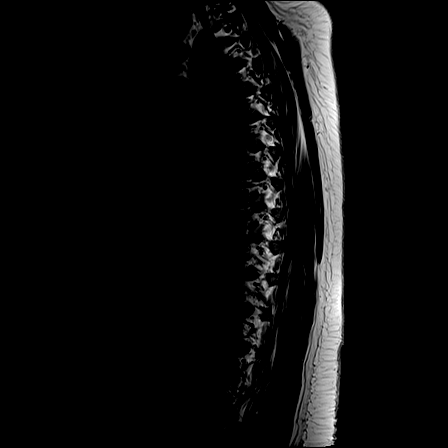
[im 7/17]
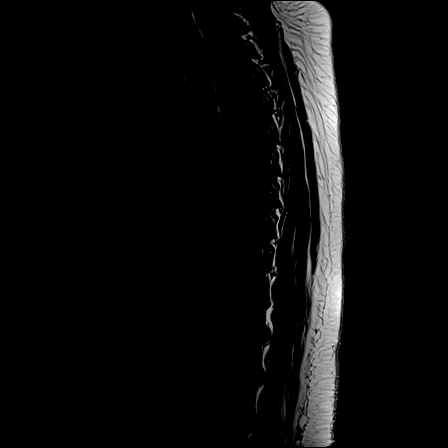
[im 10/17]
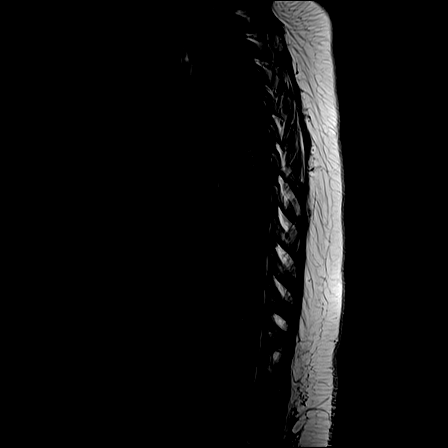
[im 17/17]
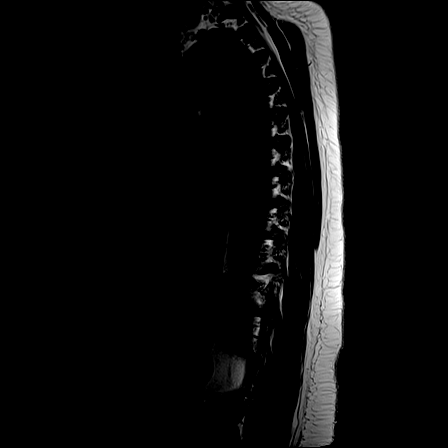

[Series 26: T2 · axial · 4.0mm · 0.59mm/px · z∈[-464,-221]mm · 8 of 39 slices shown (2 of 2)]
[im 1/39]
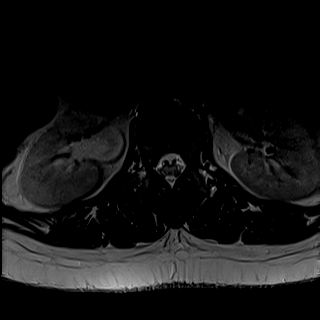
[im 6/39]
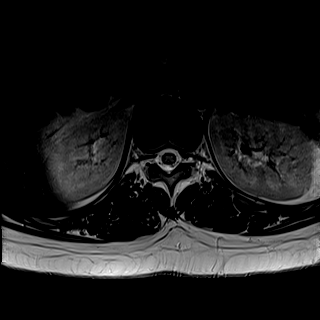
[im 12/39]
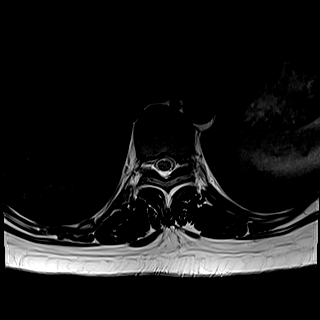
[im 18/39]
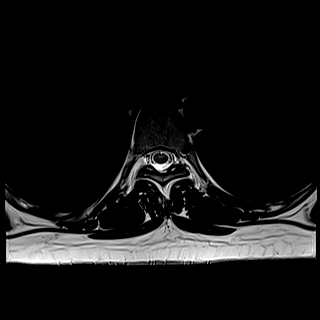
[im 21/39]
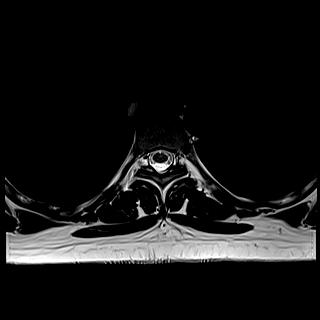
[im 27/39]
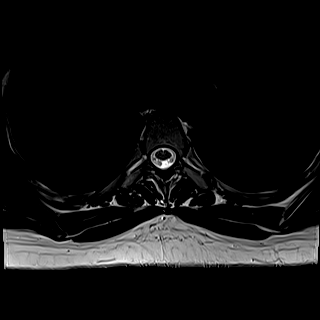
[im 33/39]
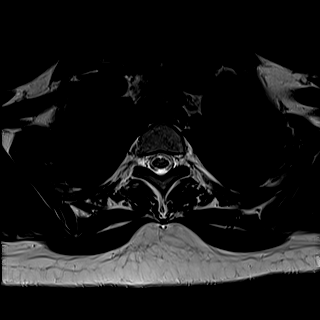
[im 39/39]
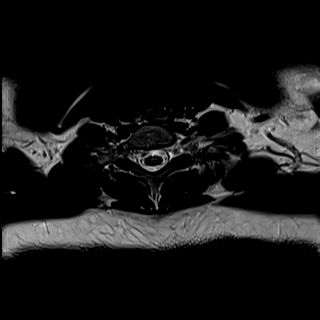

[21 of 48 positions shown; findings below may reference images not displayed]

FINDINGS: MRI HEAD FINDINGS

Brain: Cerebral volume within normal limits. There is a single
abnormal focus of T2/FLAIR signal abnormality involving the right
middle cerebellar peduncle, measuring approximately 1.1 x 1.5 cm
(series 15, image 8). Superior extension into the right lateral pons
(series 15, image 9). Incomplete brain of FLAIR signal abnormality
seen about a portion of this lesion (series 15, image 8). Finding is
indeterminate, but suspected to reflect a focal area of
demyelination. No significant regional mass effect. No associated
susceptibility artifact to suggest hemorrhage. No associated
diffusion signal abnormality.

There is a single additional punctate 3 mm focus of FLAIR
hyperintensity involving the subcortical white matter of the
anterior right frontal lobe (series 15, image 15). Otherwise no
other significant cerebral white matter disease or visible focal
parenchymal signal abnormality.

No evidence for acute or subacute infarct. Gray-white matter
differentiation maintained. No areas of chronic cortical infarction
or other insult. No acute or chronic intracranial blood products.

No other mass lesion, midline shift or mass effect. No hydrocephalus
or extra-axial fluid collection. Pituitary gland suprasellar region
normal. Midline structures intact and normal.

Vascular: Major intracranial vascular flow voids are well
maintained.

Skull and upper cervical spine: Craniocervical junction within
normal limits. Diffusely decreased T1 signal intensity seen
throughout the visualized bone marrow, nonspecific, but could be
related current pregnancy. No scalp soft tissue abnormality.

Sinuses/Orbits: Globes and orbital soft tissues within normal
limits. Paranasal sinuses are largely clear. No mastoid effusion.
Inner ear structures grossly normal.

Other: None.

MRI CERVICAL SPINE FINDINGS

Alignment: Straightening with smooth reversal of the normal cervical
lordosis. No significant listhesis.

Vertebrae: Vertebral body height maintained without acute or chronic
fracture. Diffusely decreased T1 signal intensity seen throughout
the visualized bone marrow, suspected to be related to current
pregnancy. No discrete or worrisome osseous lesions or abnormal
marrow edema.

Cord: Normal signal morphology. No cord signal changes to suggest
demyelinating disease.

Posterior Fossa, vertebral arteries, paraspinal tissues:
Unremarkable.

Disc levels:

C2-C3: Unremarkable.

C3-C4:  Unremarkable.

C4-C5: Small right paracentral disc protrusion mildly indents the
ventral thecal sac. No significant spinal stenosis. Foramina remain
patent.

C5-C6: Mild disc bulge with uncovertebral spurring. No significant
spinal stenosis. Foramina remain patent.

C6-C7:  Unremarkable.

C7-T1:  Unremarkable.

MRI THORACIC SPINE FINDINGS

Alignment: Physiologic with preservation of the normal thoracic
kyphosis. No listhesis.

Vertebrae: Vertebral body height maintained without acute or chronic
fracture. Diffusely decreased T1 signal intensity throughout the
visualized bone marrow. Subcentimeter benign hemangioma noted within
the T10 vertebral body. No worrisome osseous lesions. No abnormal
marrow edema.

Cord: Normal signal and morphology.

Paraspinal soft tissues: Unremarkable.

Disc levels: Unremarkable. No significant disc pathology. No
stenosis or neural impingement.
IMPRESSION: MRI HEAD IMPRESSION:

1. 1.5 cm focus of abnormal T2/FLAIR signal abnormality involving
the right middle cerebellar peduncle extending into the right
lateral pons. Finding is indeterminate, but favored to reflect a
focal area of demyelination. Possible changes of subacute ischemia
or mass/glioma would be the primary differential considerations,
although felt to be less likely. No significant mass effect.
2. Single additional punctate 3 mm focus of FLAIR hyperintensity
involving the subcortical right frontal lobe, indeterminate.
3. Otherwise normal brain MRI.

MRI CERVICAL AND THORACIC SPINE IMPRESSION:

1. Normal MRI appearance of the cervical and thoracic spinal cord.
No evidence for demyelinating disease.
2. Mild degenerative spondylosis at C4-5 and C5-6 without
significant stenosis or neural impingement.

ADDENDUM:
Since time of initial dictation, some additional history of acute
right-sided visual disturbance has been provided. Upon review of the
initial brain MRI, note is also made of subtle asymmetric
enlargement and heterogeneity of the right optic nerve with fuzzy
signal changes near its insertion at the posterior right lobe/optic
disc (series 15, image 10). Subtle associated asymmetric diffusion
signal noted as well. Findings highly suspicious for acute optic
neuritis. Follow-up examination with dedicated MRI of the orbits,
with and without contrast, could be performed for confirmation as
warranted.

Addended findings discussed by telephone with Dr. ROBERT SILVA at
approximately [DATE] on [DATE].

*** End of Addendum ***
FINDINGS: MRI HEAD FINDINGS

Brain: Cerebral volume within normal limits. There is a single
abnormal focus of T2/FLAIR signal abnormality involving the right
middle cerebellar peduncle, measuring approximately 1.1 x 1.5 cm
(series 15, image 8). Superior extension into the right lateral pons
(series 15, image 9). Incomplete brain of FLAIR signal abnormality
seen about a portion of this lesion (series 15, image 8). Finding is
indeterminate, but suspected to reflect a focal area of
demyelination. No significant regional mass effect. No associated
susceptibility artifact to suggest hemorrhage. No associated
diffusion signal abnormality.

There is a single additional punctate 3 mm focus of FLAIR
hyperintensity involving the subcortical white matter of the
anterior right frontal lobe (series 15, image 15). Otherwise no
other significant cerebral white matter disease or visible focal
parenchymal signal abnormality.

No evidence for acute or subacute infarct. Gray-white matter
differentiation maintained. No areas of chronic cortical infarction
or other insult. No acute or chronic intracranial blood products.

No other mass lesion, midline shift or mass effect. No hydrocephalus
or extra-axial fluid collection. Pituitary gland suprasellar region
normal. Midline structures intact and normal.

Vascular: Major intracranial vascular flow voids are well
maintained.

Skull and upper cervical spine: Craniocervical junction within
normal limits. Diffusely decreased T1 signal intensity seen
throughout the visualized bone marrow, nonspecific, but could be
related current pregnancy. No scalp soft tissue abnormality.

Sinuses/Orbits: Globes and orbital soft tissues within normal
limits. Paranasal sinuses are largely clear. No mastoid effusion.
Inner ear structures grossly normal.

Other: None.

MRI CERVICAL SPINE FINDINGS

Alignment: Straightening with smooth reversal of the normal cervical
lordosis. No significant listhesis.

Vertebrae: Vertebral body height maintained without acute or chronic
fracture. Diffusely decreased T1 signal intensity seen throughout
the visualized bone marrow, suspected to be related to current
pregnancy. No discrete or worrisome osseous lesions or abnormal
marrow edema.

Cord: Normal signal morphology. No cord signal changes to suggest
demyelinating disease.

Posterior Fossa, vertebral arteries, paraspinal tissues:
Unremarkable.

Disc levels:

C2-C3: Unremarkable.

C3-C4:  Unremarkable.

C4-C5: Small right paracentral disc protrusion mildly indents the
ventral thecal sac. No significant spinal stenosis. Foramina remain
patent.

C5-C6: Mild disc bulge with uncovertebral spurring. No significant
spinal stenosis. Foramina remain patent.

C6-C7:  Unremarkable.

C7-T1:  Unremarkable.

MRI THORACIC SPINE FINDINGS

Alignment: Physiologic with preservation of the normal thoracic
kyphosis. No listhesis.

Vertebrae: Vertebral body height maintained without acute or chronic
fracture. Diffusely decreased T1 signal intensity throughout the
visualized bone marrow. Subcentimeter benign hemangioma noted within
the T10 vertebral body. No worrisome osseous lesions. No abnormal
marrow edema.

Cord: Normal signal and morphology.

Paraspinal soft tissues: Unremarkable.

Disc levels: Unremarkable. No significant disc pathology. No
stenosis or neural impingement.
IMPRESSION: MRI HEAD IMPRESSION:

1. 1.5 cm focus of abnormal T2/FLAIR signal abnormality involving
the right middle cerebellar peduncle extending into the right
lateral pons. Finding is indeterminate, but favored to reflect a
focal area of demyelination. Possible changes of subacute ischemia
or mass/glioma would be the primary differential considerations,
although felt to be less likely. No significant mass effect.
2. Single additional punctate 3 mm focus of FLAIR hyperintensity
involving the subcortical right frontal lobe, indeterminate.
3. Otherwise normal brain MRI.

MRI CERVICAL AND THORACIC SPINE IMPRESSION:

1. Normal MRI appearance of the cervical and thoracic spinal cord.
No evidence for demyelinating disease.
2. Mild degenerative spondylosis at C4-5 and C5-6 without
significant stenosis or neural impingement.

## 2021-06-26 MED ORDER — LACTATED RINGERS IV BOLUS
1000.0000 mL | Freq: Once | INTRAVENOUS | Status: AC
  Administered 2021-06-26: 1000 mL via INTRAVENOUS
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
## 2021-06-26 NOTE — MAU Note (Addendum)
.  ABI SHOULTS is a 41 y.o. at [redacted]w[redacted]d here in MAU reporting: Since Sunday night I have lost the use of my legs. Unable to empty bladder without lifting my bladder. On Tues started loosing vision in my R eye and today can only see brightness. My R eye hurts when I look to the R.  Saw OB Weds and was told baby may be sitting on a nerve and I was referred to PT.  Saw optometrist today and was sent to the Main ED who sent me to MAU. Also my skin hurts to touch all over my body.  ?LMP: [facility] 10/25/21 ?Onset of complaint: Sunday night ?Pain score: 8 ?B/P 129 80 ?Pulse 97 ?FHT:147 ?Lab orders placed from triage:   ?O2 sat 100 ?
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
## 2021-06-27 ENCOUNTER — Encounter (HOSPITAL_COMMUNITY): Payer: Self-pay | Admitting: Obstetrics and Gynecology

## 2021-06-27 ENCOUNTER — Other Ambulatory Visit: Payer: Self-pay

## 2021-06-27 DIAGNOSIS — O09522 Supervision of elderly multigravida, second trimester: Secondary | ICD-10-CM | POA: Diagnosis not present

## 2021-06-27 DIAGNOSIS — O99842 Bariatric surgery status complicating pregnancy, second trimester: Secondary | ICD-10-CM | POA: Diagnosis present

## 2021-06-27 DIAGNOSIS — H469 Unspecified optic neuritis: Secondary | ICD-10-CM | POA: Diagnosis present

## 2021-06-27 DIAGNOSIS — G373 Acute transverse myelitis in demyelinating disease of central nervous system: Secondary | ICD-10-CM | POA: Diagnosis present

## 2021-06-27 DIAGNOSIS — Z3A22 22 weeks gestation of pregnancy: Secondary | ICD-10-CM | POA: Diagnosis not present

## 2021-06-27 DIAGNOSIS — O99352 Diseases of the nervous system complicating pregnancy, second trimester: Secondary | ICD-10-CM | POA: Diagnosis present

## 2021-06-27 MED ORDER — MUSCLE RUB 10-15 % EX CREA: TOPICAL_CREAM | CUTANEOUS | Status: DC | PRN

## 2021-06-27 MED ORDER — CYCLOBENZAPRINE HCL 10 MG PO TABS
10.0000 mg | ORAL_TABLET | Freq: Four times a day (QID) | ORAL | Status: DC | PRN
  Administered 2021-06-27 (×2): 10 mg via ORAL
  Filled 2021-06-27 (×2): qty 1

## 2021-06-27 MED ORDER — LIDOCAINE 4 % EX CREA
TOPICAL_CREAM | Freq: Every day | CUTANEOUS | Status: DC | PRN
  Filled 2021-06-27: qty 5

## 2021-06-27 MED ORDER — PRENATAL MULTIVITAMIN CH
1.0000 | ORAL_TABLET | Freq: Every day | ORAL | Status: DC
  Administered 2021-06-27 – 2021-07-01 (×5): 1 via ORAL
  Filled 2021-06-27 (×5): qty 1

## 2021-06-27 MED ORDER — LACTATED RINGERS IV BOLUS
1000.0000 mL | Freq: Once | INTRAVENOUS | Status: AC
  Administered 2021-06-27: 1000 mL via INTRAVENOUS

## 2021-06-27 MED ORDER — CALCIUM CARBONATE ANTACID 500 MG PO CHEW
2.0000 | CHEWABLE_TABLET | ORAL | Status: DC | PRN
Start: 2021-06-27 — End: 2021-07-01

## 2021-06-27 MED ORDER — FAMOTIDINE 20 MG PO TABS
10.0000 mg | ORAL_TABLET | Freq: Two times a day (BID) | ORAL | Status: DC
  Administered 2021-06-27 – 2021-07-01 (×10): 10 mg via ORAL
  Filled 2021-06-27 (×10): qty 1

## 2021-06-27 MED ORDER — SODIUM CHLORIDE 0.9 % IV SOLN
1000.0000 mg | INTRAVENOUS | Status: AC
  Administered 2021-06-27 – 2021-07-01 (×5): 1000 mg via INTRAVENOUS
  Filled 2021-06-27 (×5): qty 16

## 2021-06-27 MED ORDER — LIDOCAINE 4 % EX CREA
TOPICAL_CREAM | Freq: Three times a day (TID) | CUTANEOUS | Status: DC | PRN
  Filled 2021-06-27: qty 5

## 2021-06-27 MED ORDER — ZOLPIDEM TARTRATE 5 MG PO TABS
5.0000 mg | ORAL_TABLET | Freq: Every evening | ORAL | Status: DC | PRN
Start: 2021-06-27 — End: 2021-07-01

## 2021-06-27 MED ORDER — GABAPENTIN 100 MG PO CAPS
100.0000 mg | ORAL_CAPSULE | Freq: Three times a day (TID) | ORAL | Status: DC | PRN
  Administered 2021-06-27 – 2021-06-28 (×2): 100 mg via ORAL
  Filled 2021-06-27 (×2): qty 1

## 2021-06-27 MED ORDER — DOCUSATE SODIUM 100 MG PO CAPS
100.0000 mg | ORAL_CAPSULE | Freq: Every day | ORAL | Status: DC
  Administered 2021-06-27 – 2021-07-01 (×4): 100 mg via ORAL
  Filled 2021-06-27 (×4): qty 1

## 2021-06-27 MED ORDER — ACETAMINOPHEN 325 MG PO TABS: 650.0000 mg | ORAL_TABLET | ORAL | Status: DC | PRN

## 2021-06-27 MED ORDER — LACTATED RINGERS IV SOLN: INTRAVENOUS | Status: DC

## 2021-06-27 NOTE — Progress Notes (Addendum)
NEUROLOGY CONSULTATION PROGRESS NOTE  ? ?Date of service: June 27, 2021 ?Patient Name: [name] ?MRN:  [id number] ?DOB:  [birth year] ? ?Brief HPI  ? ?41 yo F with optic neuritis on the right as well as symptoms highly concerning for transverse myelitis.  These two findings occurring concurrently would be very worrisome for neuromyelitis optica. MRI brain with 1.5 cm focus of abnormal T2/FLAIR signal abnormality involving the right middle cerebellar peduncle extending into the right lateral pons. Finding is indeterminate, but favored to reflect a focal area of demyelination. Normal MRI appearance of the cervical and thoracic spinal cord. No evidence for demyelinating disease. ?  ?Interval Hx  ? ?She got first dose of steroids this AM around 0510. Pain around her R eye is improved but vision is the same. Legs do not feel any differerent. ? ?Discussed with Dr. Lovena Neighbours with the [facility] team and cleared for MRI with contrast. Will add MRI Orbits to the order too. If this is unrevealing, will need an LP. ? ?History of Eczema but no other autoimmune disorder. No tick bites. ? ?Vitals  ? ?Vitals:  ? 06/26/21 2013 06/27/21 0729 06/27/21 1143  ?BP: 129/80 (!) 108/58 116/60  ?Pulse: 91 69 80  ?Resp: _0 ?Temp: 98.5 ?F (36.9 ?C) 98.3 ?F (36.8 ?C) 98.2 ?F (36.8 ?C)  ?TempSrc:  Oral Oral  ?SpO2: 100% 99% 99%  ?Weight: 87.5 kg    ?Height: _1  (1.702 m)    ?  ? ?Body mass index is 30.23 kg/m?. ? ?Physical Exam  ? ?General: Laying comfortably in bed; in no acute distress.  ?HENT: Normal oropharynx and mucosa. Normal external appearance of ears and nose.  ?Neck: Supple, no pain or tenderness  ?CV: No JVD. No peripheral edema.  ?Pulmonary: Symmetric Chest rise. Normal respiratory effort.  ?Abdomen: Soft to touch, non-tender.  ?Ext: No cyanosis, edema, or deformity  ?Skin: No rash. Normal palpation of skin.   ?Musculoskeletal: Normal digits and nails by inspection. No clubbing.  ? ?Neurologic Examination  ?Mental  status/Cognition: Alert, oriented to self, place, month and year, good attention.  ?Speech/language: Fluent, comprehension intact, object naming intact, repetition intact.  ?Cranial nerves:  ? CN II Pupils equal and reactive to light, significantly decreased visual acuity in R eye with central vision being worse than peripheral. Right afferent pupillar defect.  ? CN III,IV,VI EOM intact, no gaze preference or deviation, no nystagmus  ? CN V normal sensation in V1, V2, and V3 segments bilaterally   ? CN VII no asymmetry, no nasolabial fold flattening   ? CN VIII normal hearing to speech   ? CN IX & X normal palatal elevation, no uvular deviation   ? CN XI 5/5 head turn and 5/5 shoulder shrug bilaterally   ? CN XII midline tongue protrusion   ? ?Motor:  ?Muscle bulk: normal, tone normal ?Mvmt Root Nerve  Muscle Right Left Comments  ?SA C5/6 Ax Deltoid 5 5   ?EF C5/6 Mc Biceps 5 5   ?EE C6/7/8 Rad Triceps 5 5   ?WF C6/7 Med FCR     ?WE C7/8 PIN ECU     ?F Ab C8/T1 U ADM/FDI 5 5   ?HF L1/2/3 Fem Illopsoas 3 4   ?KE L2/3/4 Fem Quad 3 4+   ?DF L4/5 D Peron Tib Ant 4 4+   ?PF S1/2 Tibial Grc/Sol 4 4+   ? ?Reflexes: ? Right Left Comments  ?Pectoralis     ? Biceps (C5/6)  2 2   ?Brachioradialis (C5/6) 2 2   ? Triceps (C6/7) 2 2   ? Patellar (L3/4) 2+ 2+ Some ?spread of patellar reflex.  ? Achilles (S1) 2 2   ? Hoffman     ? Plantar down down   ?Jaw jerk   ? ?Sensation: ? Light touch Allodynia worse on the left. Light touch decreased from T8 below to touch  ? Pin prick   ? Temperature   ? Vibration   ?Proprioception   ? ?Coordination/Complex Motor:  ?- Finger to Nose intact BL ?- Heel to shin unable to do  ?- Rapid alternating movement are normal ?- Gait: Deferred for patient safety. ? ?Labs  ? ?Basic Metabolic Panel:  ?Lab Results  ?Component Value Date  ? NA 136 06/26/2021  ? K 3.7 06/26/2021  ? CO2 23 06/26/2021  ? GLUCOSE 95 06/26/2021  ? BUN 14 06/26/2021  ? CREATININE 0.49 06/26/2021  ? CALCIUM 9.0 06/26/2021  ? GFRNONAA  >60 06/26/2021  ? GFRAA  05/15/2009  ?  >60        ?The eGFR has been calculated ?using the MDRD equation. ?This calculation has not been ?validated in all clinical ?situations. ?eGFR's persistently ?<60 mL/min signify ?possible Chronic Kidney Disease.  ? ?HbA1c: No results found for: HGBA1C ?LDL:  ?Lab Results  ?Component Value Date  ? [place] 81 12/31/2011  ? ?Urine Drug Screen: No results found for: LABOPIA, COCAINSCRNUR, [place], [place], THCU, LABBARB  ?Alcohol Level No results found for: ETH ?No results found for: PHENYTOIN, ZONISAMIDE, LAMOTRIGINE, LEVETIRACETA ?No results found for: PHENYTOIN, PHENOBARB, VALPROATE, CBMZ ? ?Imaging and Diagnostic studies  ? ?MRI HEAD IMPRESSION: ? ?1. 1.5 cm focus of abnormal T2/FLAIR signal abnormality involving ?the right middle cerebellar peduncle extending into the right ?lateral pons. Finding is indeterminate, but favored to reflect a ?focal area of demyelination. Possible changes of subacute ischemia ?or mass/glioma would be the primary differential considerations, ?although felt to be less likely. No significant mass effect. ?2. Single additional punctate 3 mm focus of FLAIR hyperintensity ?involving the subcortical right frontal lobe, indeterminate. ?3. Otherwise normal brain MRI. ? ?MRI CERVICAL AND THORACIC SPINE IMPRESSION: ? ?1. Normal MRI appearance of the cervical and thoracic spinal cord. ?No evidence for demyelinating disease. ?2. Mild degenerative spondylosis at C4-5 and C5-6 without ?significant stenosis or neural impingement. ? ? ?Electronically Signed ?By: Jeannine Boga M.D. ?On: 06/27/2021 00:52 ? ? ?Impression  ? ?41 yo F with optic neuritis on the right as well as BL lower ext weakness, allodynia and numbness from T8 below. Symptoms highly concerning for transverse myelitis.  These two findings occurring concurrently would be very worrisome for neuromyelitis optica. MRI brain with 1.5 cm focus of abnormal T2/FLAIR signal abnormality involving  the right middle cerebellar peduncle extending into the right lateral pons. MRI C and T spine w/o contrast with no additional demyelinating lesions. ? ?I do not think that imaging explains her presentation well. ? ?Differential includes: Neuromyelitis optica, MS, Neurosarcoidosis. Less likely tuberculosis, systemic autoimmune disease with secondary neurological involvement. ? ?Recommendations  ?- Ordered MOG Ab and Soluble IL2 receptor to be collected in AM ?- MRI Brain, C, T spine with contrast. ?- MRI Orbit with and without contrast. ?- If MRI with contrast is non revealing, will need LP at bedside. ?- continue Solumedrol 1G Daily x 5 doses. If no significant improvement after 3rd day of steroids, will need to start preparing for PLEX. ?- Gabapentin 174m TID for neuropathic pain ?-  Lidocaine cream PRN (will use sparingly due to insufficient safety data per pharmacy in the setting of pregnancy). ? ? ?Plan discussed with Dr. Philis Pique and with the patient and significant other at bedside. ?___________________________________________________________________ ? ? ?Thank you for the opportunity to take part in the care of this patient. If you have any further questions, please contact the neurology consultation attending. ? ?Signed, ? ?Donnetta Simpers ?[facility] ?Pager Number [phone] ? ?

## 2021-06-27 NOTE — Progress Notes (Signed)
Patient states no pain unless being touched or moved. Pain in lower back and thighs 7/10 when moving legs or standing. Patient states increase in stiffness of lower extremities bilaterally, and increased weakness BLE. ?

## 2021-06-27 NOTE — Progress Notes (Signed)
Patient got up to bedside commode with 2 person contact guard assist and walker. Patient voided 50 mL and stated that she was unable to empty her bladder. Bladder scan revealed greater than 220 mL, with no definitive volume. Verbal order received to in and out cath. Patient tolerated the procedure well. Patient states having sensitivity to touch, cold, and pressure in lower abdominal region and BLE. Flinches with touch. States her right eye is almost completely dark "like a curtain is pulled." Decreased visual field in left eye is chronic. Patient states she experiences moderate to severe pain in the lower back with movement and transport. ?
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
## 2021-06-27 NOTE — Progress Notes (Signed)
Brief Neuro update: ? ?Radiology techs had asked me that they are unable to do contrast MRIs until this is discussed with a Neuro radiologist and cleared by them. I reached out to Dr. Acquanetta Chain and discussed the need to evaluate for potential NMO, Neuro Sarcoidosis or other autoimmune autoimmune disorder. Dr. Jeannine Boga agrees that risk from contrast is low specially since she is [redacted] weeks pregnant. Earlier my discussion with Dr. Philis Pique and she agrees that the risk of contrast is low. Dr. Jeannine Boga with call and discuss with radiology techs. ? ?Donnetta Simpers ?[facility] ?Pager Number [phone] ?

## 2021-06-28 ENCOUNTER — Encounter (HOSPITAL_COMMUNITY): Payer: Self-pay | Admitting: Obstetrics and Gynecology

## 2021-06-28 ENCOUNTER — Inpatient Hospital Stay (HOSPITAL_COMMUNITY): Payer: 59

## 2021-06-28 DIAGNOSIS — H469 Unspecified optic neuritis: Secondary | ICD-10-CM | POA: Diagnosis not present

## 2021-06-28 IMAGING — MR MR ORBITS WO/W CM
6 of 8 series · 31 of 48 positions shown · IV contrast (Gadavist)
Comparison: MRI head without contrast [DATE].

CLINICAL DATA: Right-sided optic neuritis. Administration of
gadolinium during second trimester pregnancy was discussed with the
patient and proper consents were signed.

EXAM:
MRI OF THE ORBITS WITHOUT AND WITH CONTRAST
TECHNIQUE: Multiplanar, multi-echo pulse sequences of the orbits and
surrounding structures were acquired including fat saturation
techniques, before and after intravenous contrast administration.
CONTRAST:  8.7mL GADAVIST GADOBUTROL 1 MMOL/ML IV SOLN

[Series 5: T1 · axial · non-contrast · 3.0mm · 0.37mm/px · z∈[-83,-25]mm · 5 of 15 slices shown (1 of 2)]
[im 1/15]
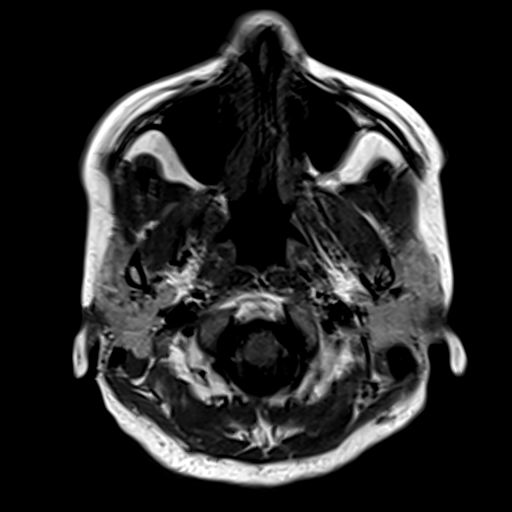
[im 4/15]
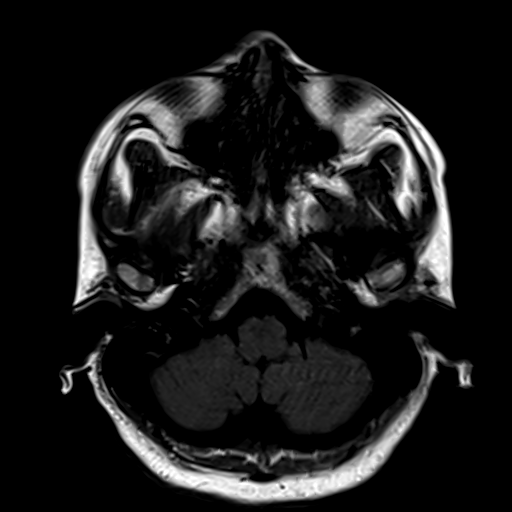
[im 8/15]
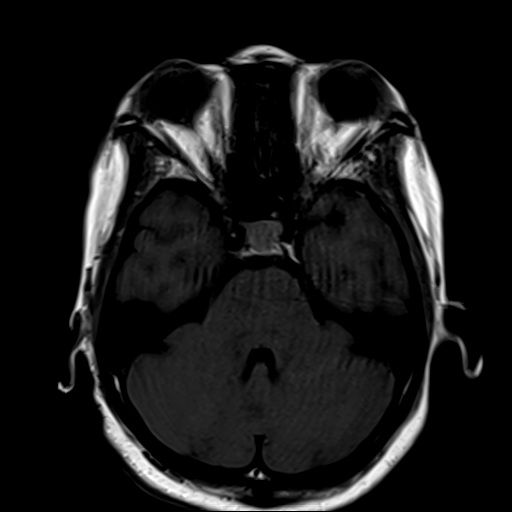
[im 11/15]
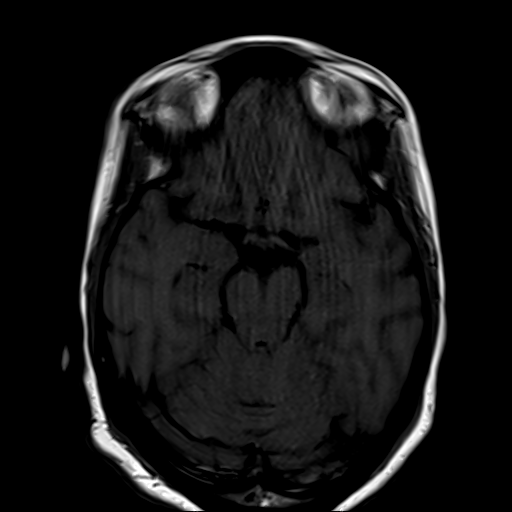
[im 15/15]
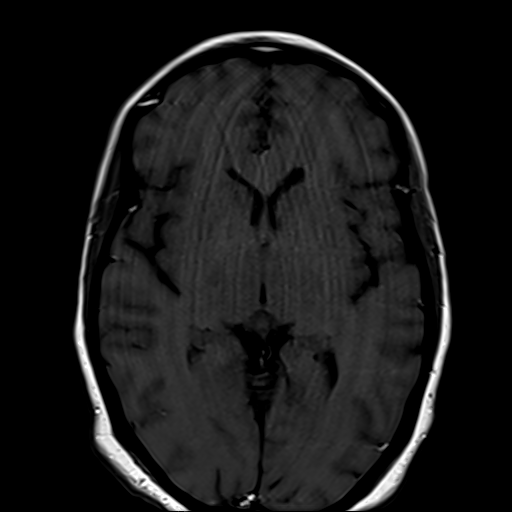

[Series 6: T2 fat-sat · axial · 3.0mm · 0.54mm/px · z∈[-83,-25]mm · 5 of 15 slices shown (1 of 4)]
[im 1/15]
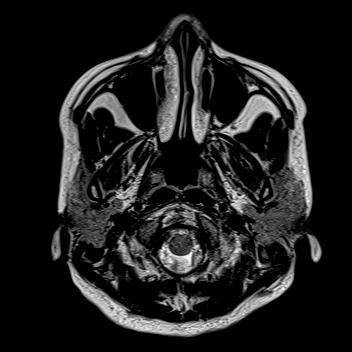
[im 4/15]
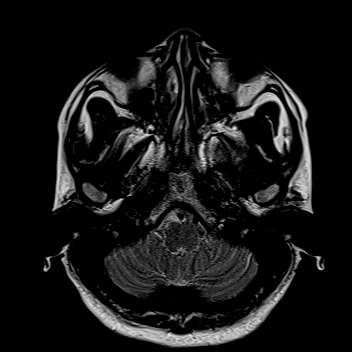
[im 8/15]
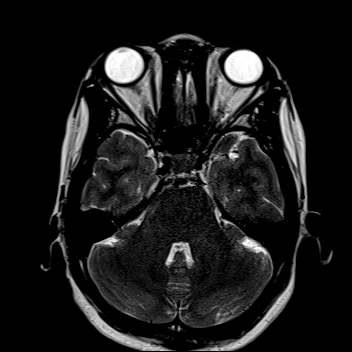
[im 11/15]
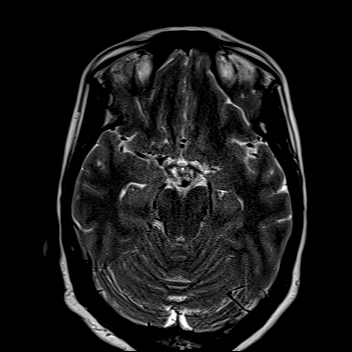
[im 15/15]
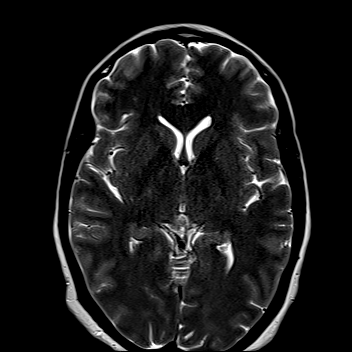

[Series 8: T2 fat-sat · axial · 3.0mm · 0.54mm/px · z∈[-83,-25]mm · 5 of 15 slices shown (2 of 4)]
[im 1/15]
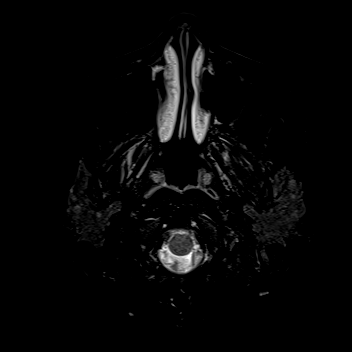
[im 4/15]
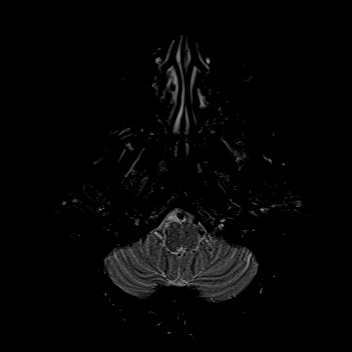
[im 8/15]
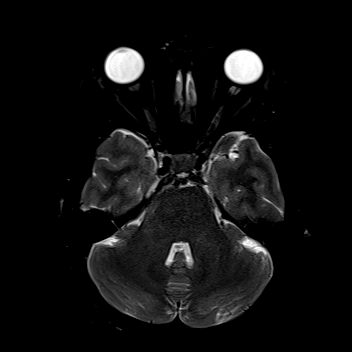
[im 11/15]
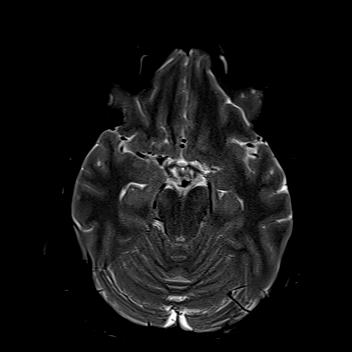
[im 15/15]
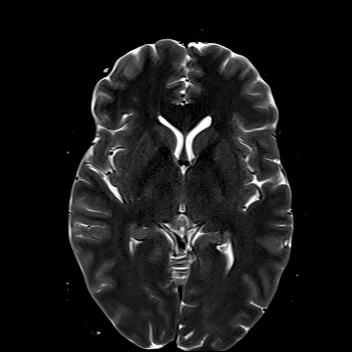

[Series 9: T2 fat-sat · coronal · 3.0mm · 0.54mm/px · 7 of 22 slices shown (3 of 4)]
[im 1/22]
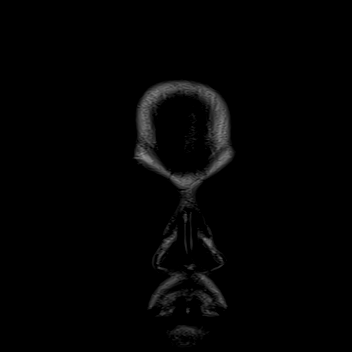
[im 4/22]
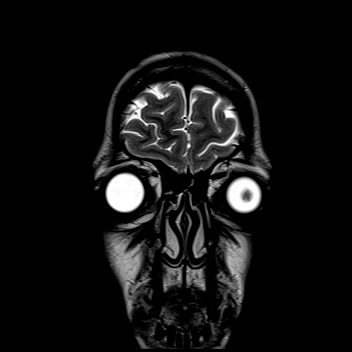
[im 8/22]
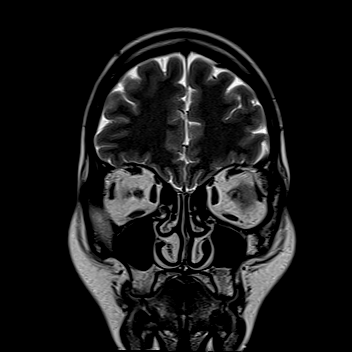
[im 11/22]
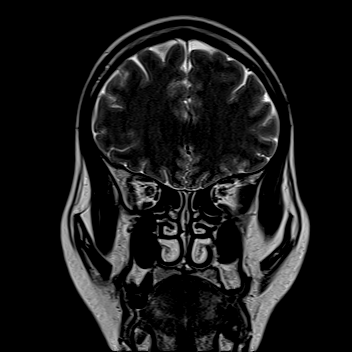
[im 15/22]
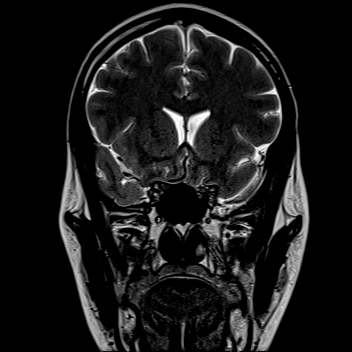
[im 18/22]
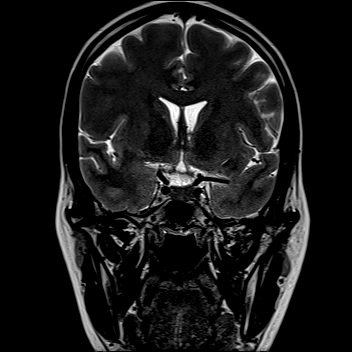
[im 22/22]
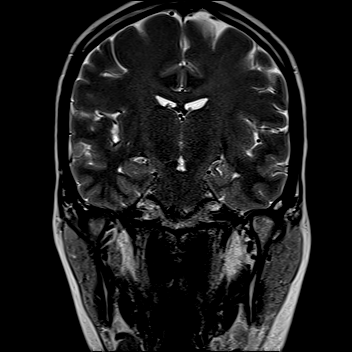

[Series 11: T2 fat-sat · coronal · 3.0mm · 0.54mm/px · 7 of 22 slices shown (4 of 4)]
[im 1/22]
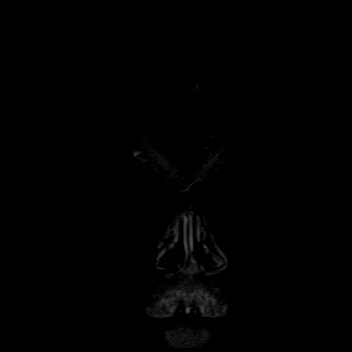
[im 4/22]
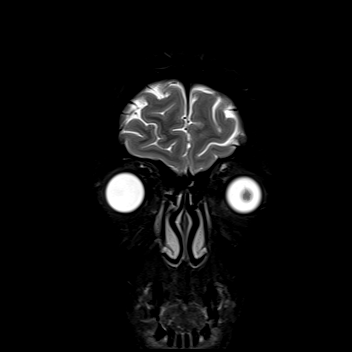
[im 8/22]
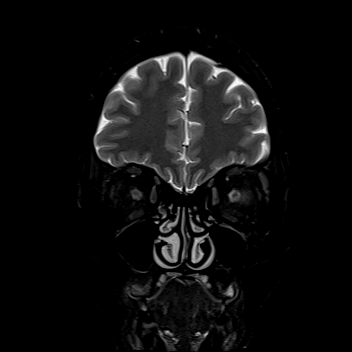
[im 11/22]
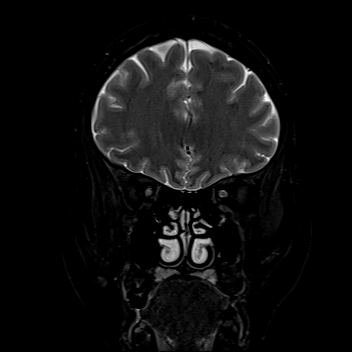
[im 15/22]
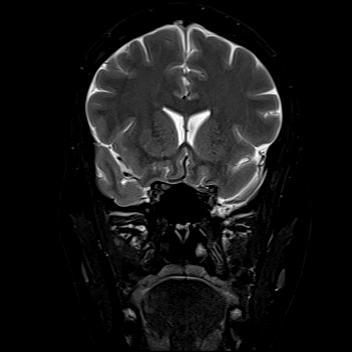
[im 18/22]
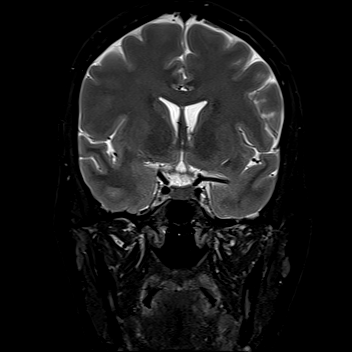
[im 22/22]
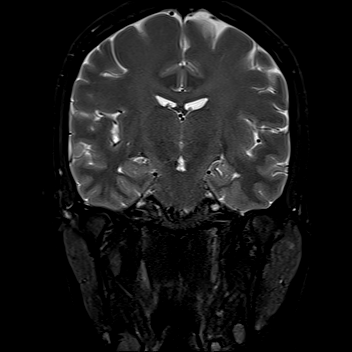

[Series 12: T1 · coronal · 3.0mm · 0.37mm/px · 2 of 22 slices shown (2 of 2)]
[im 1/22]
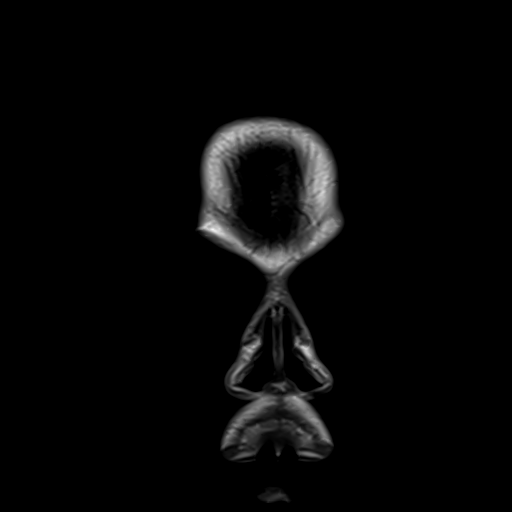
[im 4/22]
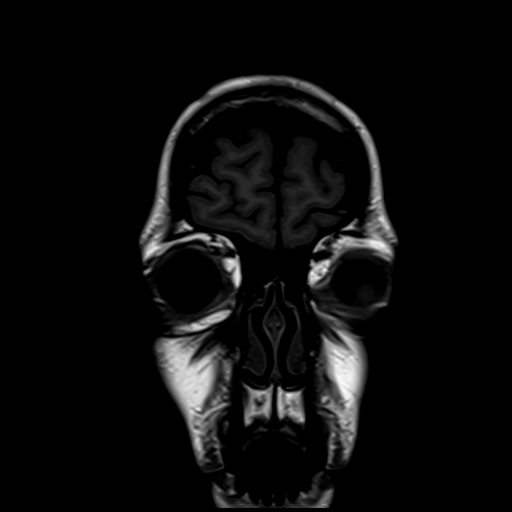

[31 of 48 positions shown; findings below may reference images not displayed]

FINDINGS: Orbits: Globes are within normal limits bilaterally. Subtle T2
hyperintensity is present within the right optic nerve. This is best
seen on the coronal images. The right optic nerve also demonstrates
enhancement. Enhancement extends beyond the orbital apex up to the
chiasm.

No focal mass lesion is present. The left orbit is within normal
limits. Rectus muscles are within normal limits. Lacrimal glands are
normal bilaterally.

Visualized sinuses: The paranasal sinuses and mastoid air cells are
clear.

Soft tissues: Periorbital soft tissues are within normal limits.

Limited intracranial: The area of T2 hyperintensity in the right
cerebellar peduncle is again noted. Subtle enhancement is present
within the posteromedial aspect of the signal abnormality.
IMPRESSION: 1. Abnormal T2 hyperintensity and enhancement of the right optic
nerve extending beyond the orbital apex up to the chiasm. This is
consistent with optic neuritis.
2. Focal area of T2 hyperintensity in the right cerebellar peduncle
with subtle enhancement.
3. Combination of findings is consistent with neuromyelitis MERRIWEATHER.

## 2021-06-28 IMAGING — MR MR CERVICAL SPINE W/ CM
4 series · 23 of 48 positions shown · IV contrast (Gadavist)
Comparison: MRI cervical and thoracic spine from 2 days ago.

CLINICAL DATA: Lower extremity weakness. Concern for transverse
myelitis.

EXAM:
MRI CERVICAL AND THORACIC SPINE WITH CONTRAST
TECHNIQUE: Multiplanar and multiecho pulse sequences of the cervical spine, to
include the craniocervical junction and cervicothoracic junction,
and thoracic spine, were obtained with intravenous contrast.
CONTRAST:  8.7mL GADAVIST GADOBUTROL 1 MMOL/ML IV SOLN

[Series 9: T1 · sagittal · 3.0mm · 0.69mm/px · 7 of 15 slices shown (1 of 2)]
[im 1/15]
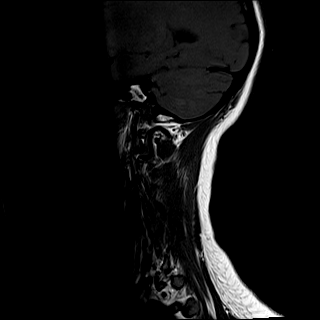
[im 3/15]
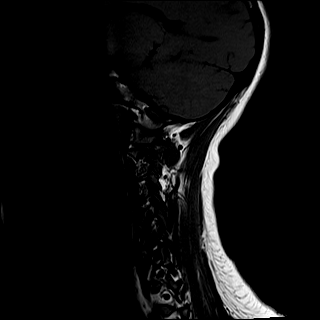
[im 5/15]
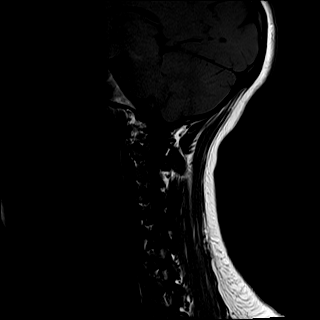
[im 8/15]
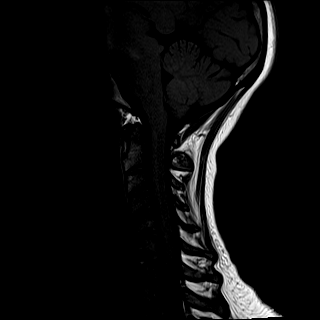
[im 10/15]
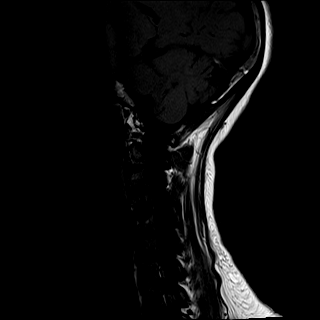
[im 12/15]
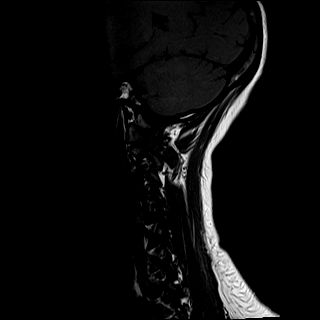
[im 15/15]
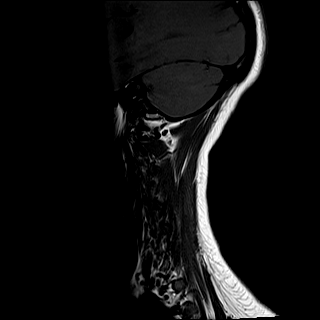

[Series 10: T1 · axial · 3.0mm · 0.39mm/px · z∈[-206,-103]mm · 10 of 40 slices shown (2 of 2)]
[im 3/40]
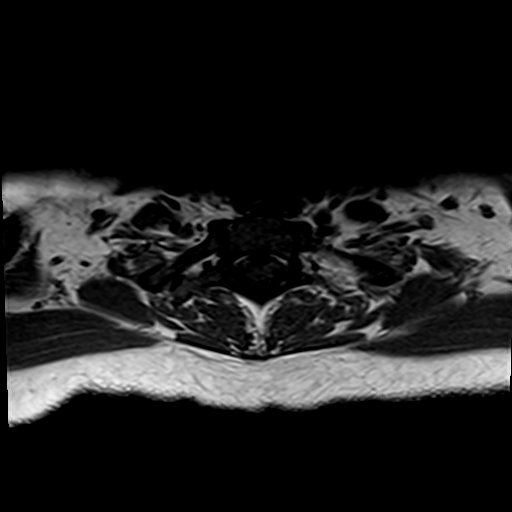
[im 5/40]
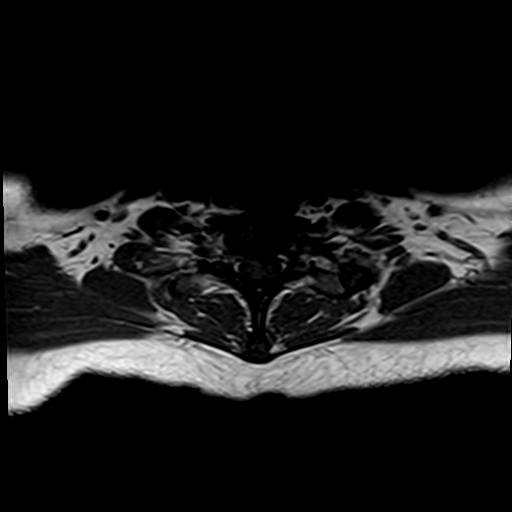
[im 8/40]
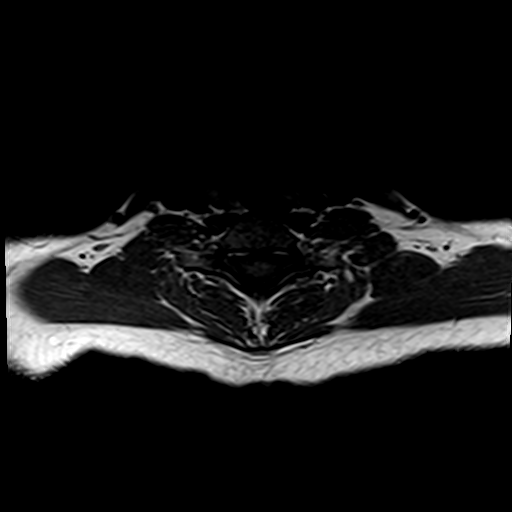
[im 13/40]
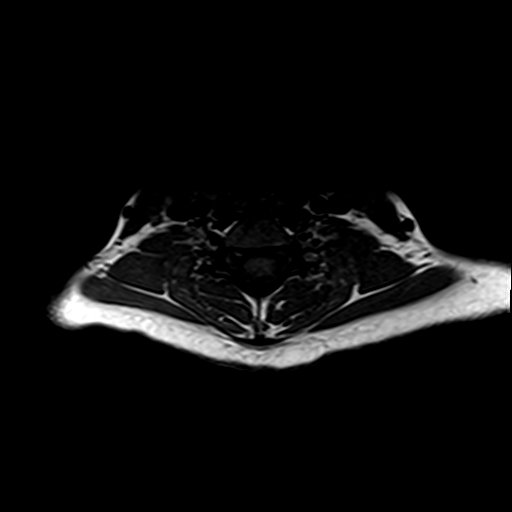
[im 18/40]
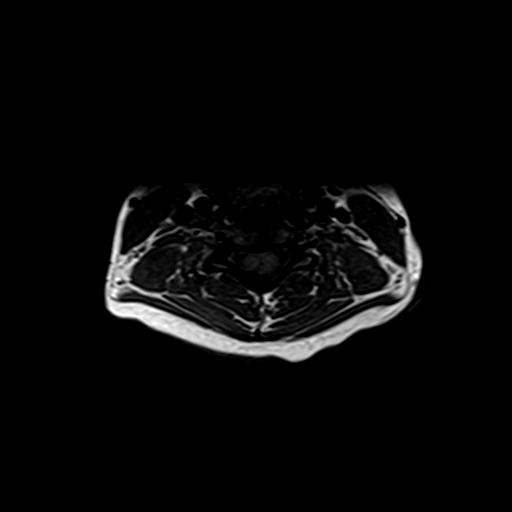
[im 20/40]
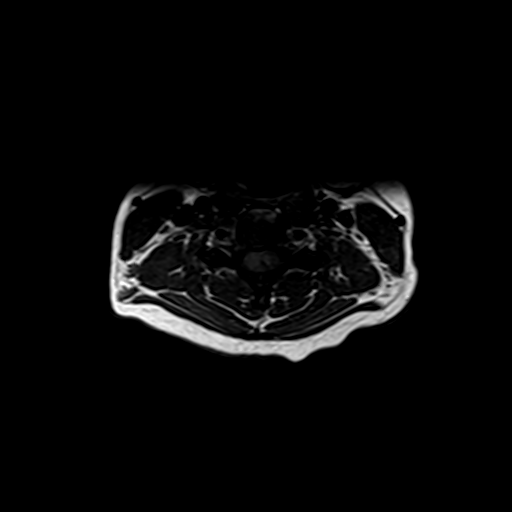
[im 22/40]
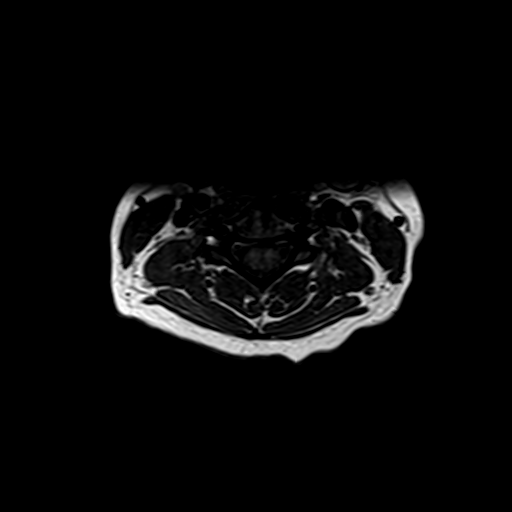
[im 27/40]
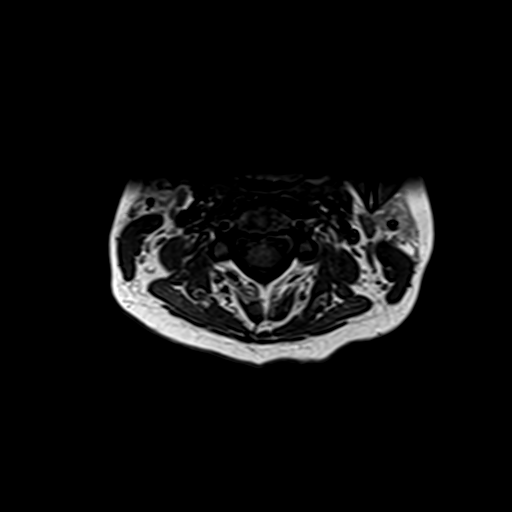
[im 32/40]
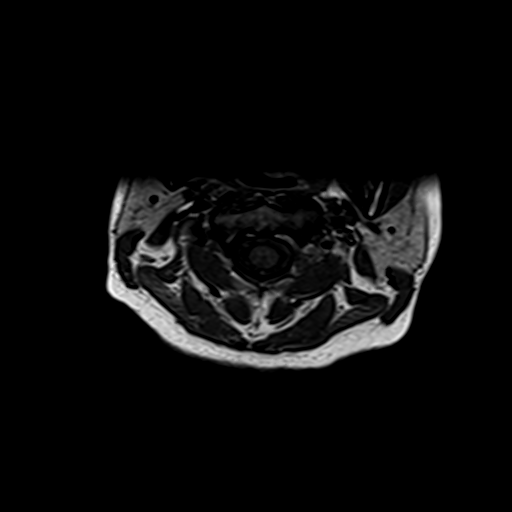
[im 35/40]
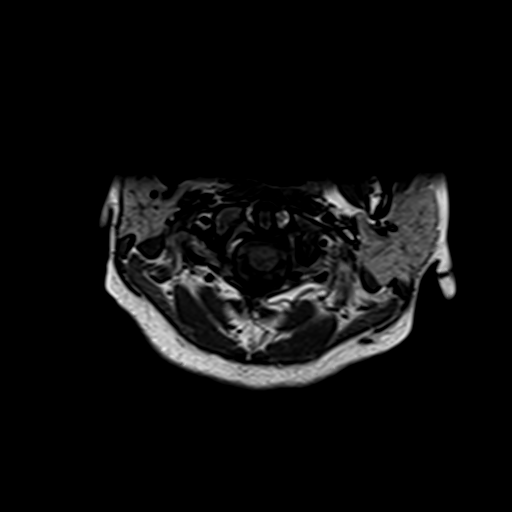

[Series 11: T1 fat-sat post-contrast · sagittal · 3.0mm · 0.43mm/px · 3 of 15 slices shown]
[im 3/15]
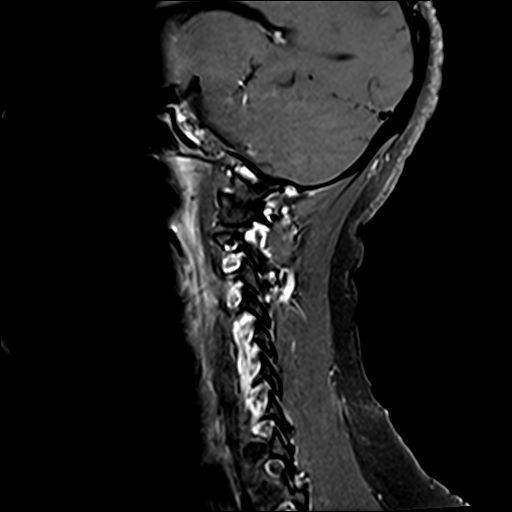
[im 8/15]
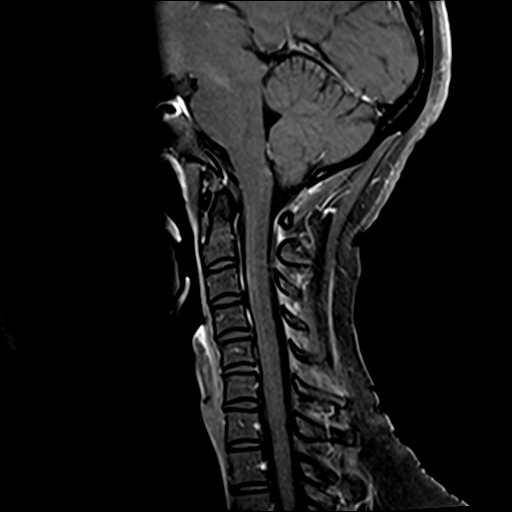
[im 12/15]
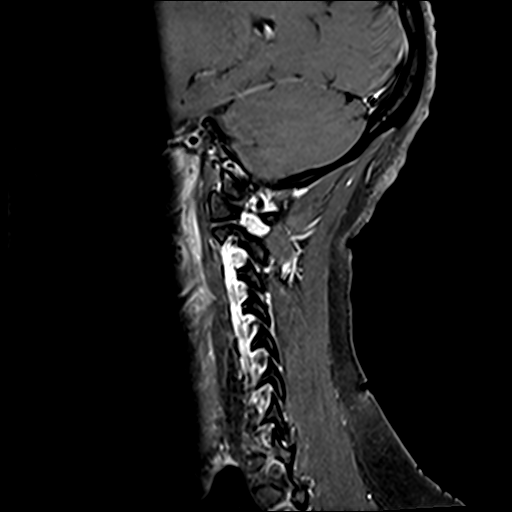

[Series 12: T1 post-contrast · axial · 3.0mm · 0.39mm/px · z∈[-199,-103]mm · 3 of 40 slices shown]
[im 5/40]
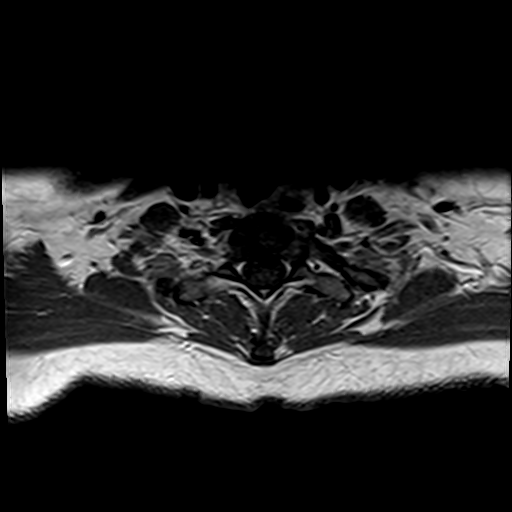
[im 20/40]
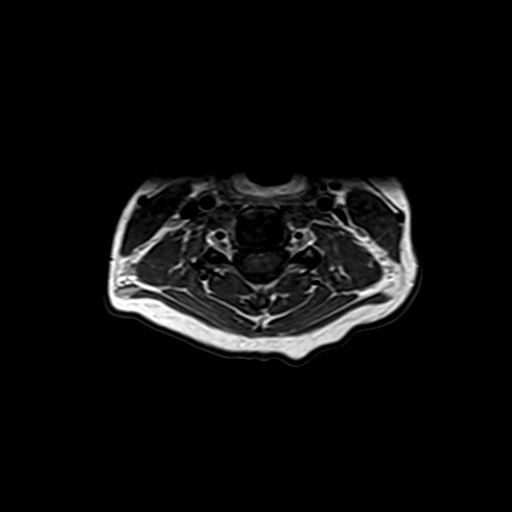
[im 35/40]
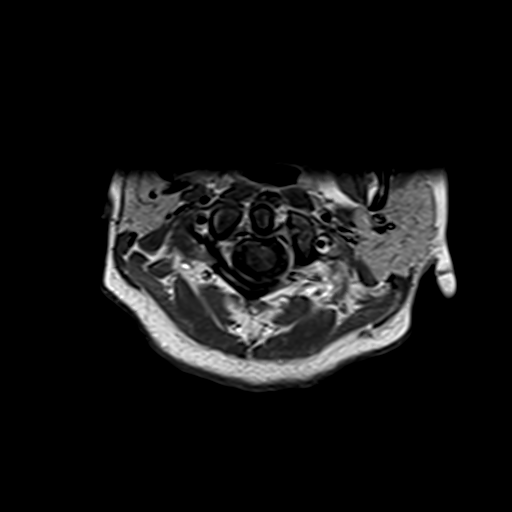

[23 of 48 positions shown; findings below may reference images not displayed]

FINDINGS: MRI CERVICAL SPINE FINDINGS

Alignment: Unchanged mild reversal of the normal cervical lordosis.

Vertebrae: No fracture, evidence of discitis, or bone lesion.

Cord: Normal signal and morphology.  No intrathecal enhancement.

Posterior Fossa, vertebral arteries, paraspinal tissues: Negative.

Disc levels:

Unchanged from 2 days prior. No significant spinal canal or
neuroforaminal stenosis.

MRI THORACIC SPINE FINDINGS

Alignment:  Physiologic.

Vertebrae: No fracture, evidence of discitis, or bone lesion.

Cord:  Normal signal and morphology.  No intrathecal enhancement.

Paraspinal and other soft tissues: Negative.

Disc levels:

Normal.
IMPRESSION: 1. Normal contrast-enhanced MRI of the cervical and thoracic spine.
No evidence of transverse myelitis or demyelinating disease.

## 2021-06-28 IMAGING — MR MR HEAD W/ CM
1 of 3 series · 11 of 48 positions shown · IV contrast (Gadavist)
Comparison: MR head without contrast [DATE]

CLINICAL DATA: Demyelinating disease. Second trimester pregnancy.
Patient was consented for gadolinium during pregnancy. Abnormal MRI
of the brain.

EXAM:
MRI HEAD WITH CONTRAST
TECHNIQUE: Multiplanar, multiecho pulse sequences of the brain and surrounding
structures were obtained with intravenous contrast.
CONTRAST:  8.7mL GADAVIST GADOBUTROL 1 MMOL/ML IV SOLN

[Series 7: T1 post-contrast · coronal · 5.0mm · 0.34mm/px · 11 of 31 slices shown]
[im 1/31]
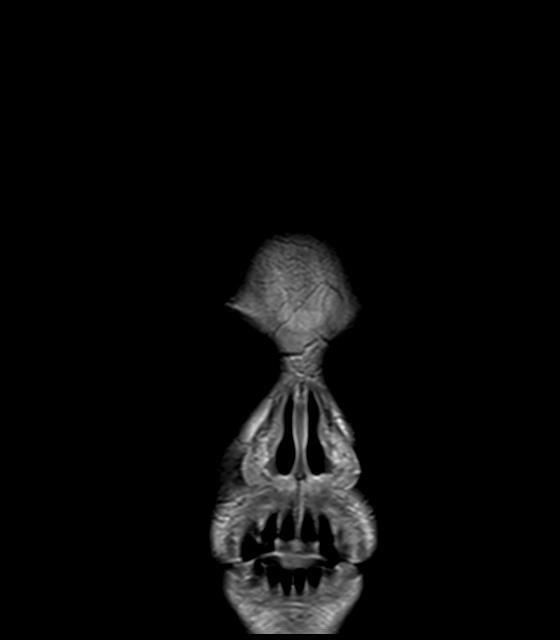
[im 4/31]
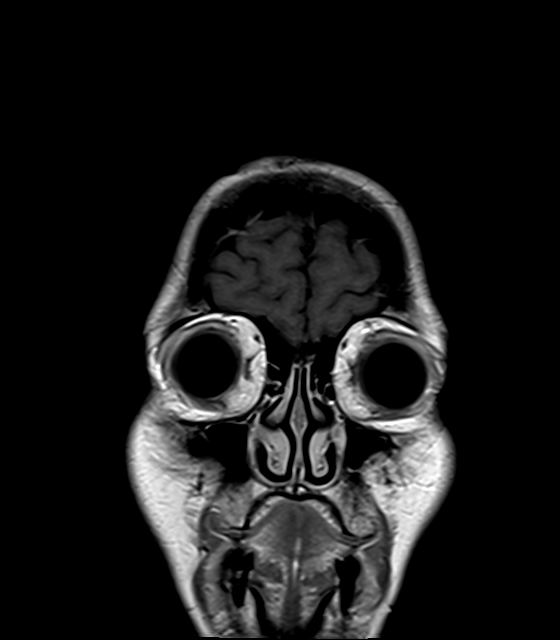
[im 7/31]
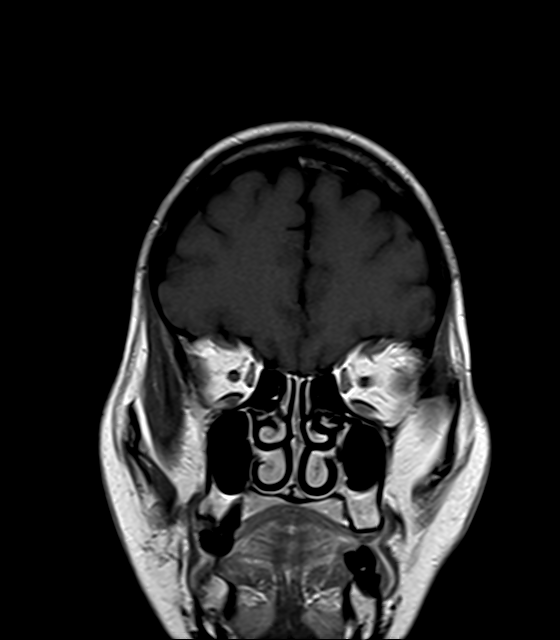
[im 10/31]
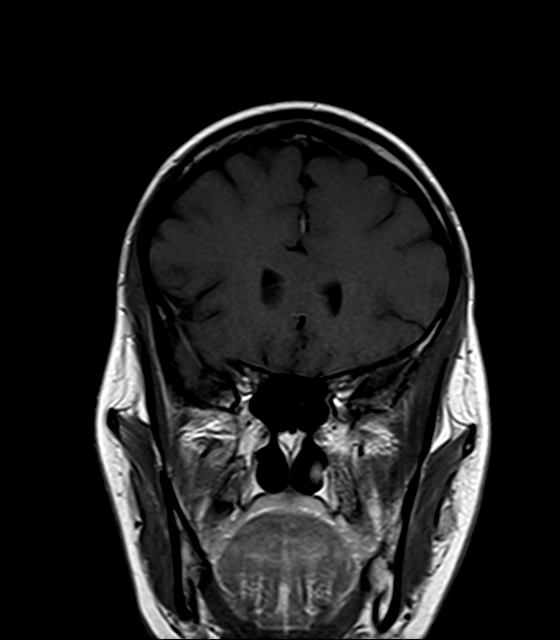
[im 13/31]
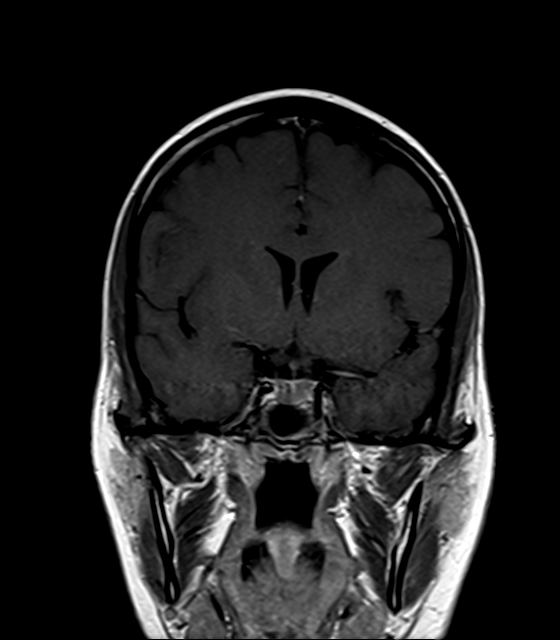
[im 16/31]
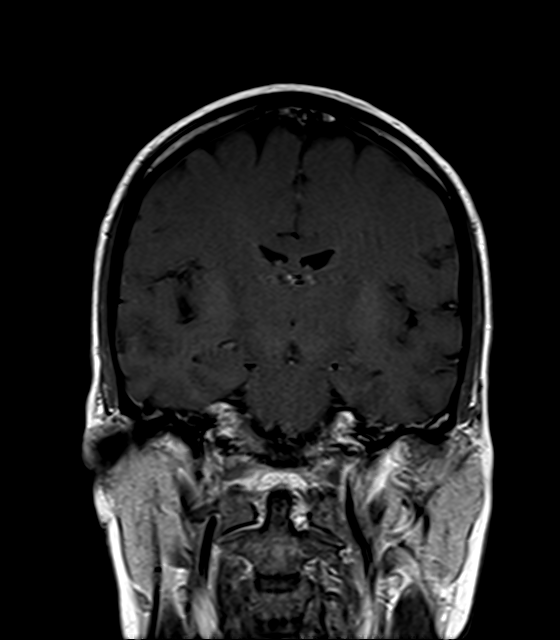
[im 19/31]
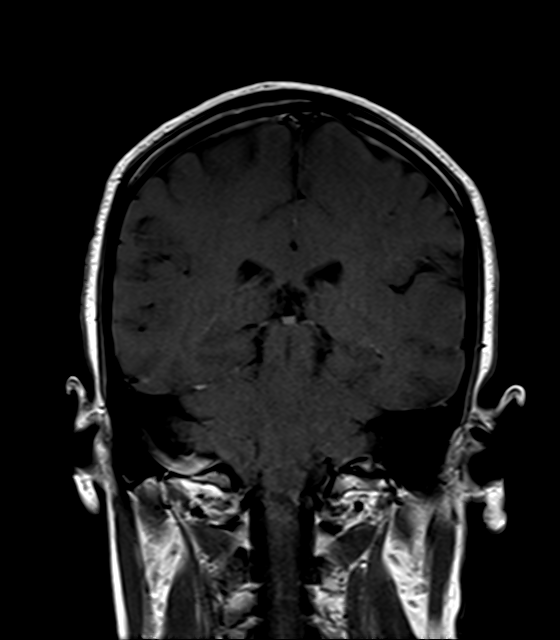
[im 22/31]
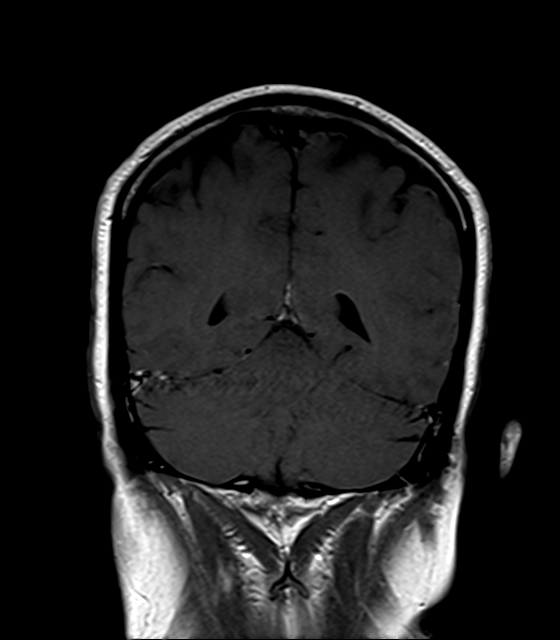
[im 25/31]
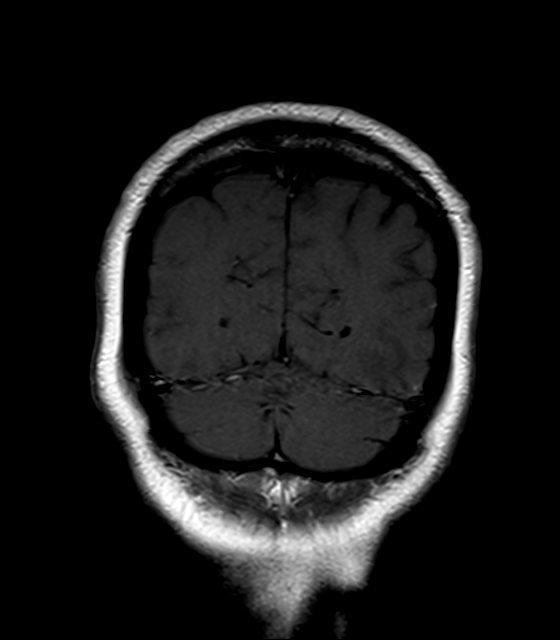
[im 28/31]
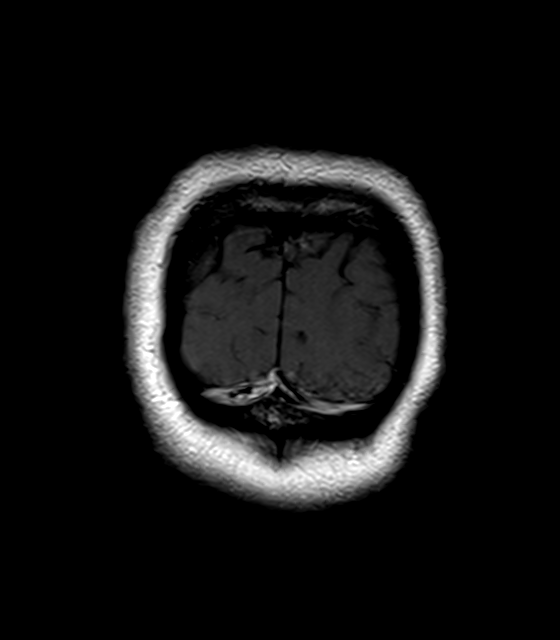
[im 31/31]
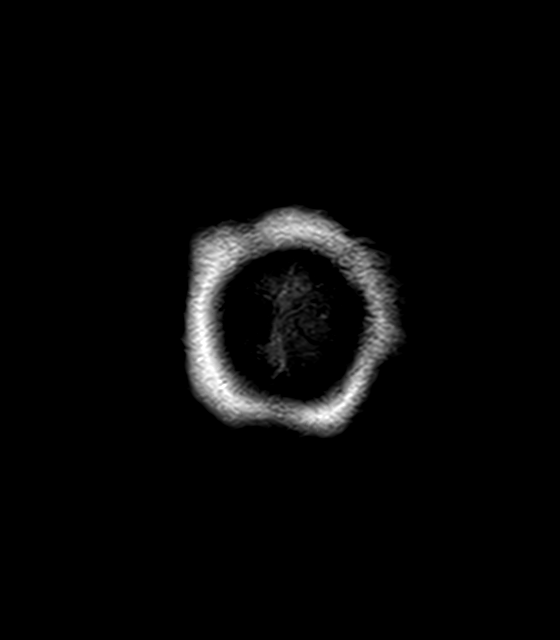

[11 of 48 positions shown; findings below may reference images not displayed]

FINDINGS: Brain: Postcontrast images through the brain demonstrate no
pathologic enhancement. The subtle focus of enhancement in the right
brachium pontis demonstrated on the orbit MRI is not appreciated on
this study. Right optic nerve enhancement is noted.
IMPRESSION: 1. The subtle focus of enhancement in the right brachium pontis
demonstrated on the orbit MRI is not appreciated on this study.
2. No other focal enhancement of the brain
3. Right optic nerve enhancement. Findings consistent with optic
neuritis. Please see full discussion in the report of the MRI
orbits.

## 2021-06-28 IMAGING — MR MR THORACIC SPINE W/ CM
4 of 5 series · 21 of 48 positions shown · IV contrast (gadavist)
Comparison: MRI cervical and thoracic spine from 2 days ago.

CLINICAL DATA: Lower extremity weakness. Concern for transverse
myelitis.

EXAM:
MRI CERVICAL AND THORACIC SPINE WITH CONTRAST
TECHNIQUE: Multiplanar and multiecho pulse sequences of the cervical spine, to
include the craniocervical junction and cervicothoracic junction,
and thoracic spine, were obtained with intravenous contrast.
CONTRAST:  8.7mL GADAVIST GADOBUTROL 1 MMOL/ML IV SOLN

[Series 11: T1 · sagittal · 6.0mm · 1.09mm/px · 4 of 9 slices shown (1 of 3)]
[im 1/9]
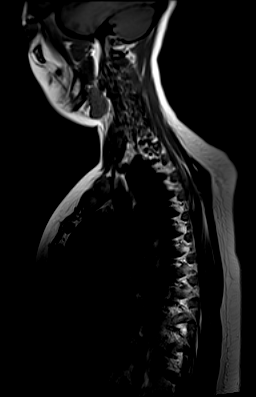
[im 3/9]
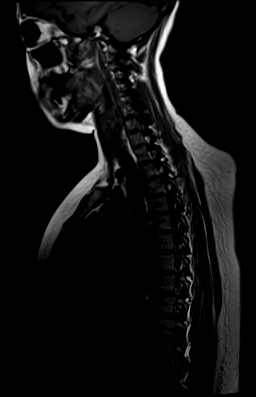
[im 6/9]
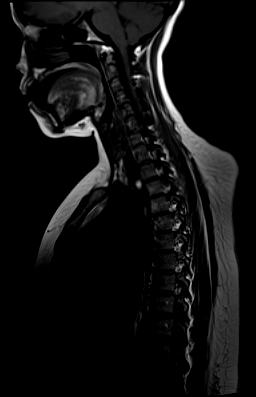
[im 9/9]
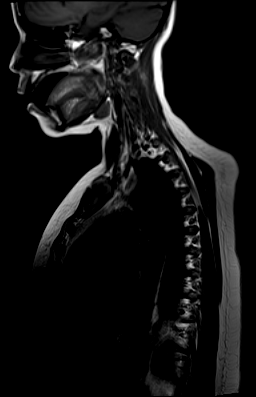

[Series 12: T1 · sagittal · 3.0mm · 0.71mm/px · 7 of 17 slices shown (2 of 3)]
[im 1/17]
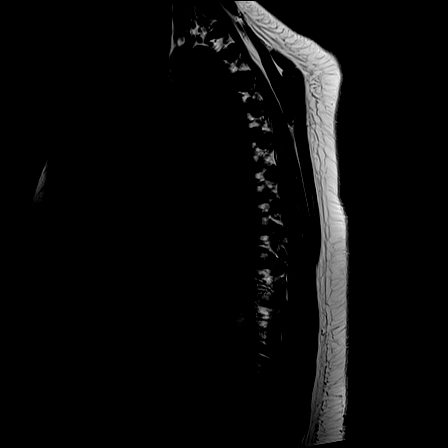
[im 3/17]
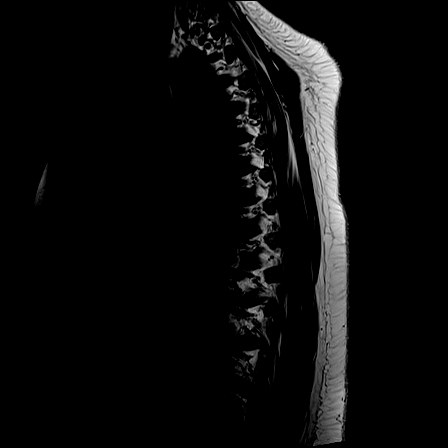
[im 6/17]
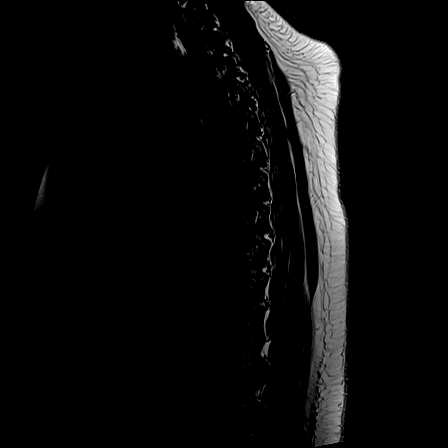
[im 9/17]
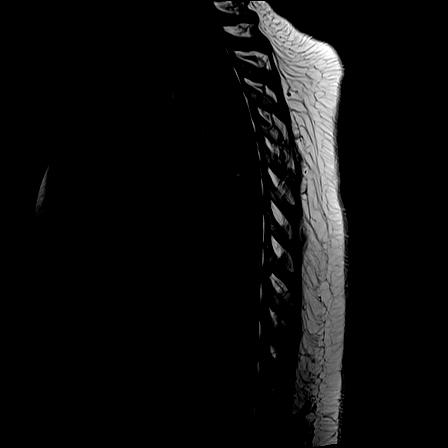
[im 11/17]
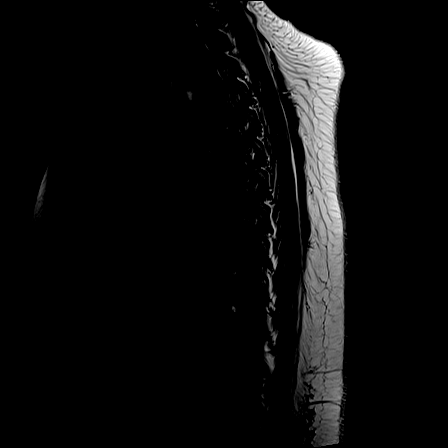
[im 14/17]
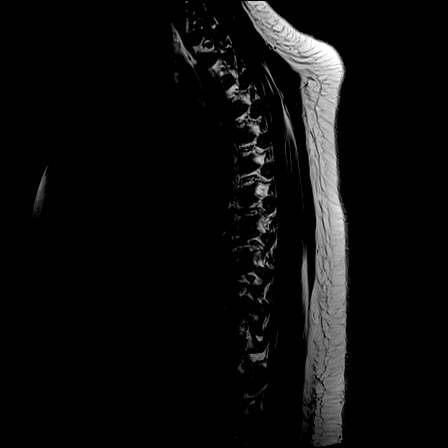
[im 17/17]
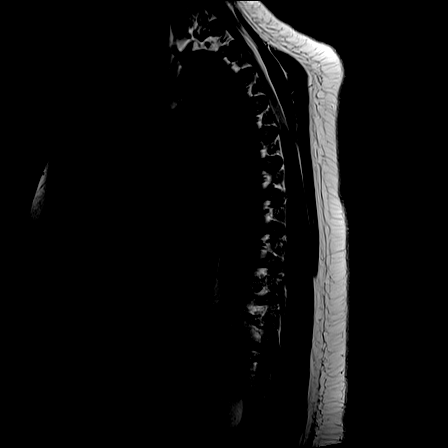

[Series 13: T1 · axial · non-contrast · 4.0mm · 0.31mm/px · z∈[-429,-216]mm · 7 of 39 slices shown (3 of 3)]
[im 1/39]
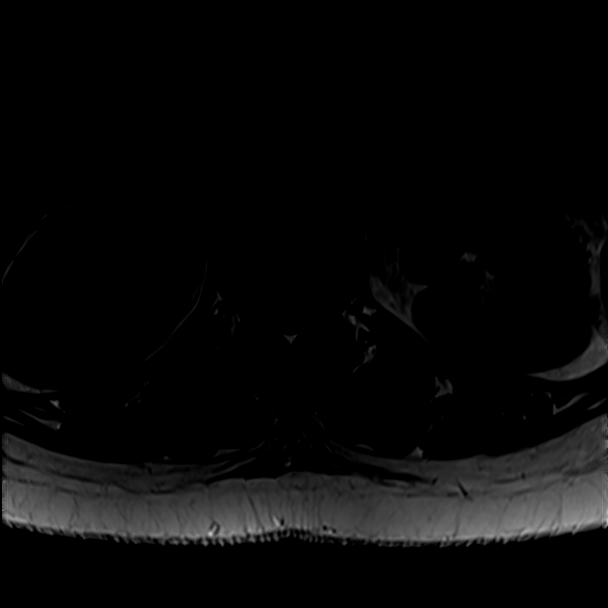
[im 6/39]
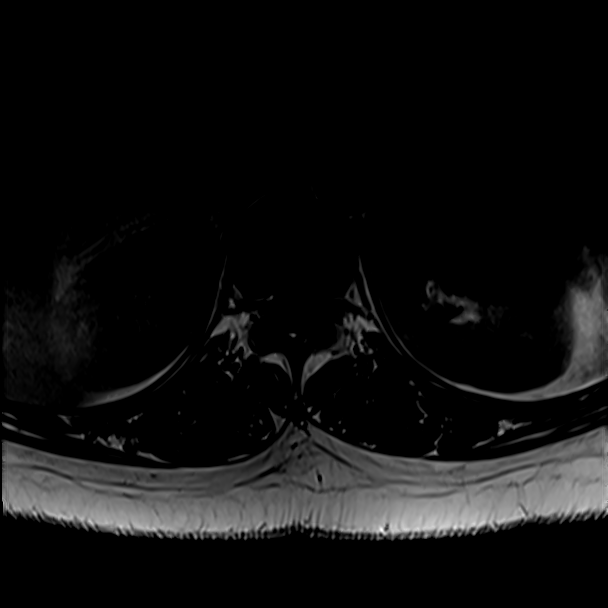
[im 11/39]
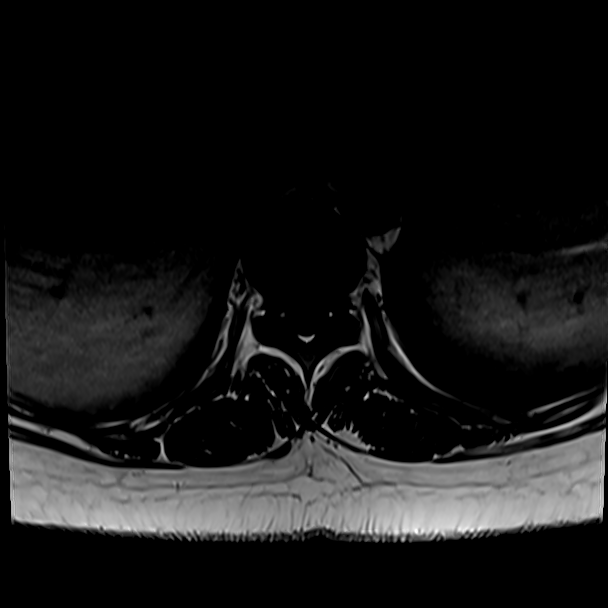
[im 17/39]
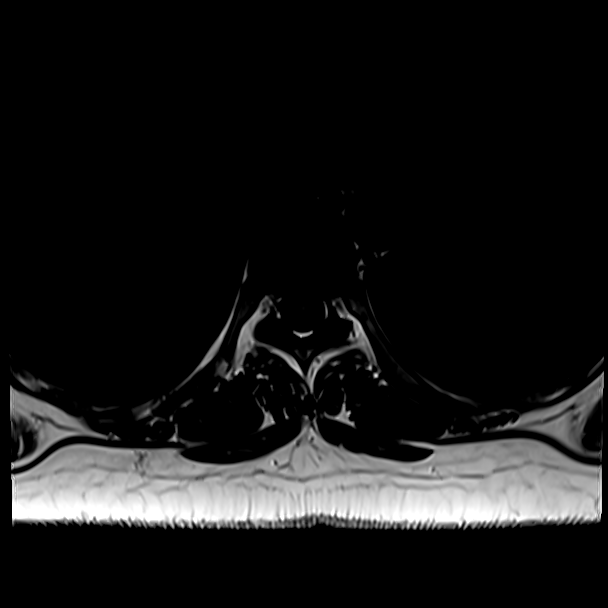
[im 20/39]
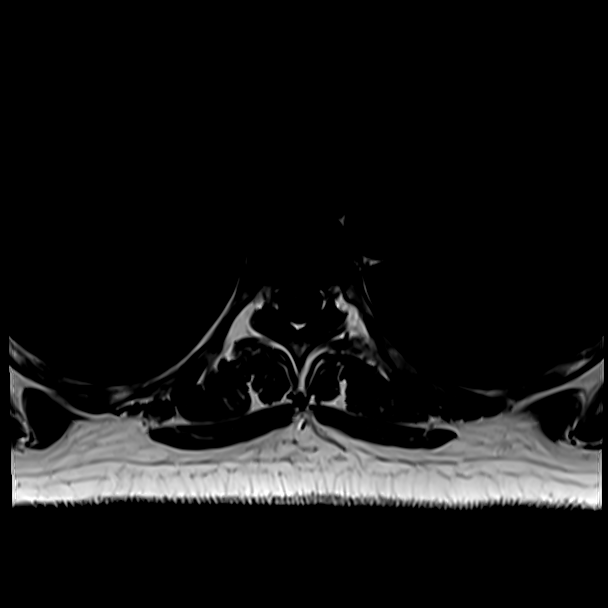
[im 22/39]
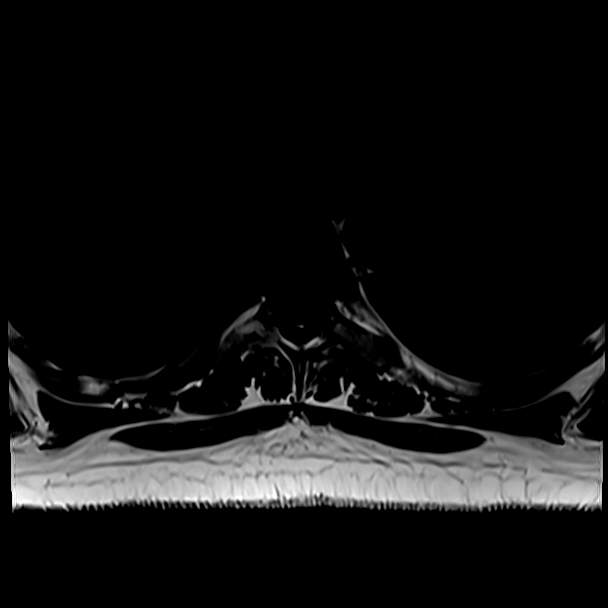
[im 33/39]
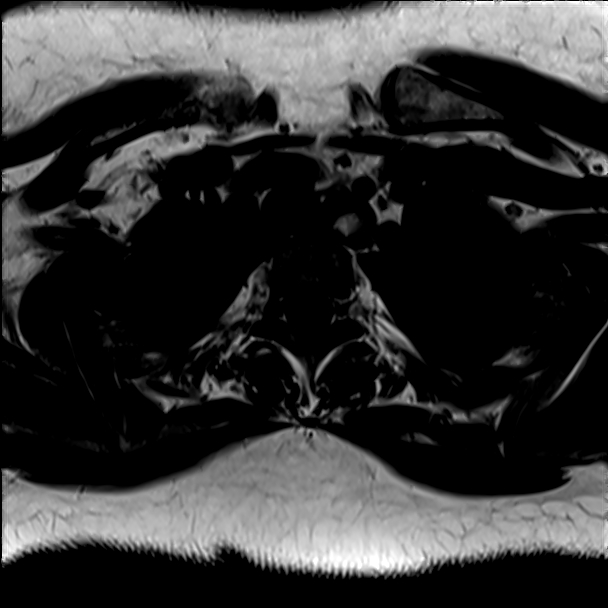

[Series 15: T1 fat-sat post-contrast · sagittal · 3.0mm · 0.71mm/px · 3 of 17 slices shown]
[im 3/17]
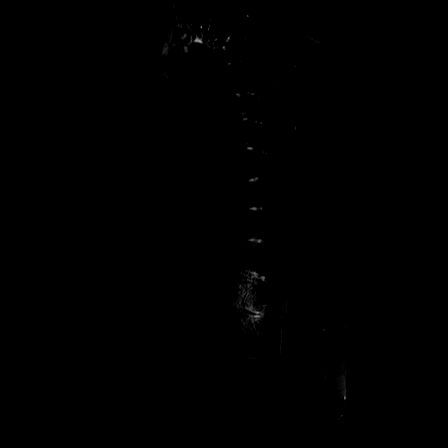
[im 9/17]
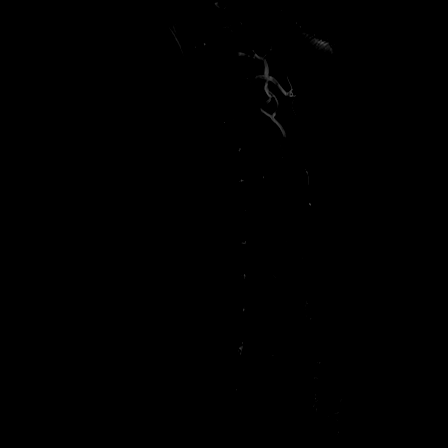
[im 14/17]
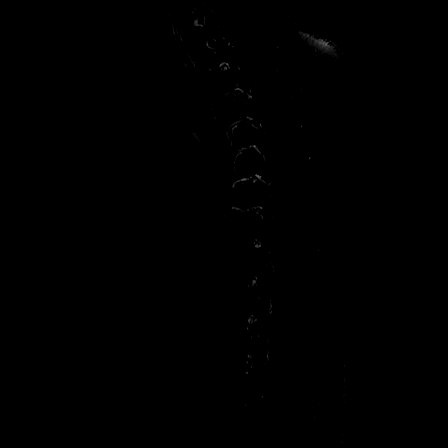

[21 of 48 positions shown; findings below may reference images not displayed]

FINDINGS: MRI CERVICAL SPINE FINDINGS

Alignment: Unchanged mild reversal of the normal cervical lordosis.

Vertebrae: No fracture, evidence of discitis, or bone lesion.

Cord: Normal signal and morphology.  No intrathecal enhancement.

Posterior Fossa, vertebral arteries, paraspinal tissues: Negative.

Disc levels:

Unchanged from 2 days prior. No significant spinal canal or
neuroforaminal stenosis.

MRI THORACIC SPINE FINDINGS

Alignment:  Physiologic.

Vertebrae: No fracture, evidence of discitis, or bone lesion.

Cord:  Normal signal and morphology.  No intrathecal enhancement.

Paraspinal and other soft tissues: Negative.

Disc levels:

Normal.
IMPRESSION: 1. Normal contrast-enhanced MRI of the cervical and thoracic spine.
No evidence of transverse myelitis or demyelinating disease.

## 2021-06-28 MED ORDER — GADOBUTROL 1 MMOL/ML IV SOLN
8.7000 mL | Freq: Once | INTRAVENOUS | Status: AC | PRN
  Administered 2021-06-28: 8.7 mL via INTRAVENOUS

## 2021-06-28 NOTE — Evaluation (Signed)
Physical Therapy Evaluation ?Patient Details ?Name: [name] ?MRN: [id number] ?DOB: 1980-06-22 ?Today's Date: 06/28/2021 ? ?History of Present Illness ? 41 y.o. female who presented to [facility] 06/27/21 at 40 5/[redacted]wks pregnant with complaint of weakness in LE, inability to walk or void without assistance, loss of vision and pain in her right eye, and painful touch diffusely over her skin. 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 MRI without constrast: rt middle cerebellar peduncle into rt lateral pons focal area of demyelination; normal cervical and thoracic spine.  ?Clinical Impression ?  ?Pt admitted secondary to problem above with deficits below. PTA patient was independent and working outside the home. She is a former marathon runner. Pt currently requires min assist with RW to ambulate 20 ft--limited by leg fatigue with weakness. Discussed current recommendation is HHPT (once discharged), with patient sleeping and bathing on first floor (at the sink as the only shower is upstairs). Patient did have a fall on the stairs leading upstairs despite husband's assistance. Anticipate patient will benefit from acute PT to address problems listed below.Will continue to follow acutely to maximize functional mobility independence and safety.   ?   ?   ? ?Recommendations for follow up therapy are one component of a multi-disciplinary discharge planning process, led by the attending physician.  Recommendations may be updated based on patient status, additional functional criteria and insurance authorization. ? ?Follow Up Recommendations Home health PT ? ?  ?Assistance Recommended at Discharge Intermittent Supervision/Assistance  ?Patient can return home with the following ? A little help with walking and/or transfers;A little help with bathing/dressing/bathroom;Assistance with cooking/housework;Help with stairs or ramp for entrance ? ?  ?Equipment Recommendations BSC/3in1  ?Recommendations for  Other Services ? OT consult  ?  ?Functional Status Assessment Patient has had a recent decline in their functional status and demonstrates the ability to make significant improvements in function in a reasonable and predictable amount of time.  ? ?  ?Precautions / Restrictions Precautions ?Precautions: Fall  ? ?  ? ?Mobility ? Bed Mobility ?Overal bed mobility: Needs Assistance ?Bed Mobility: Supine to Sit, Sit to Supine ?  ?  ?Supine to sit: Supervision, HOB elevated ?Sit to supine: Min assist ?  ?General bed mobility comments: min assist to raise legs up onto bed ?  ? ?Transfers ?Overall transfer level: Needs assistance ?Equipment used: Rolling walker (2 wheels) ?Transfers: Sit to/from Stand ?Sit to Stand: Min assist ?  ?  ?  ?  ?  ?General transfer comment: vc for safe use of RW; steadying assist upon standing with mild dizziness ?  ? ?Ambulation/Gait ?Ambulation/Gait assistance: Min assist ?Gait Distance (Feet): 20 Feet ?Assistive device: Rolling walker (2 wheels) ?Gait Pattern/deviations: Step-through pattern, Decreased stride length ?  ?Gait velocity interpretation: <1.8 ft/sec, indicate of risk for recurrent falls ?  ?General Gait Details: with fatigue, knees became wobbly but without buckling; initially assist and max cues for turning with RW; progressed to minguard during second turn ? ?Stairs ?  ?  ?  ?  ?  ? ?Wheelchair Mobility ?  ? ?Modified Rankin (Stroke Patients Only) ?  ? ?  ? ?Balance Overall balance assessment: Needs assistance ?Sitting-balance support: No upper extremity supported, Feet supported ?Sitting balance-Leahy Scale: Good ?  ?  ?Standing balance support: Bilateral upper extremity supported, Reliant on assistive device for balance ?  ?  ?  ?  ?  ?  ?  ?  ?  ?  ?  ?  ?  ?   ? ? ? ?  Pertinent Vitals/Pain Pain Assessment ?Pain Assessment: No/denies pain  ? ? ?Home Living Family/patient expects to be discharged to:: Private residence ?Living Arrangements: Spouse/significant other ?Available  Help at Discharge: Family;Available PRN/intermittently (but can have family come stay if needed (husband works close by to home)) ?Type of Home: House ?Home Access: Stairs to enter ?Entrance Stairs-Rails: None ?Entrance Stairs-Number of Steps: 1 ?Alternate Level Stairs-Number of Steps: flight ?Home Layout: Two level;1/2 bath on main level;Bed/bath upstairs (has been sleeping on couch; husband assisting up to shower, but recently fell going up the steps) ?Home Equipment: Agricultural consultant (2 wheels) ?Additional Comments: RW from deceased family member  ?  ?Prior Function Prior Level of Function : Independent/Modified Independent ?  ?  ?  ?  ?  ?  ?Mobility Comments: working outside the home ?  ?  ? ? ?Hand Dominance  ?   ? ?  ?Extremity/Trunk Assessment  ? Upper Extremity Assessment ?Upper Extremity Assessment: Overall WFL for tasks assessed ?  ? ?Lower Extremity Assessment ?Lower Extremity Assessment: RLE deficits/detail;LLE deficits/detail ?RLE Deficits / Details: AROM WFL; hip/knee extension 3+, great toe extension/DF 5/5 ?RLE Sensation: decreased light touch ?LLE Deficits / Details: AROM WFL; hip/knee extension 4, great toe extension/DF 5/5 ?LLE Sensation: decreased light touch ?  ? ?Cervical / Trunk Assessment ?Cervical / Trunk Assessment: Other exceptions ?Cervical / Trunk Exceptions: numbness lower torso  ?Communication  ? Communication: No difficulties  ?Cognition Arousal/Alertness: Awake/alert ?Behavior During Therapy: [facility] for tasks assessed/performed ?Overall Cognitive Status: Within Functional Limits for tasks assessed ?  ?  ?  ?  ?  ?  ?  ?  ?  ?  ?  ?  ?  ?  ?  ?  ?  ?  ?  ? ?  ?General Comments   ? ?  ?Exercises    ? ?Assessment/Plan  ?  ?PT Assessment Patient needs continued PT services  ?PT Problem List Decreased strength;Decreased activity tolerance;Decreased balance;Decreased mobility;Decreased knowledge of use of DME;Impaired sensation ? ?   ?  ?PT Treatment Interventions DME instruction;Gait  training;Stair training;Functional mobility training;Therapeutic activities;Therapeutic exercise;Balance training;Patient/family education   ? ?PT Goals (Current goals can be found in the Care Plan section)  ?Acute Rehab PT Goals ?Patient Stated Goal: return to normal activities ?PT Goal Formulation: With patient ?Time For Goal Achievement: 07/12/21 ?Potential to Achieve Goals: Good ? ?  ?Frequency Min 5X/week ?  ? ? ?Co-evaluation   ?  ?  ?  ?  ? ? ?  ?AM-PAC PT "6 Clicks" Mobility  ?Outcome Measure Help needed turning from your back to your side while in a flat bed without using bedrails?: None ?Help needed moving from lying on your back to sitting on the side of a flat bed without using bedrails?: A Little ?Help needed moving to and from a bed to a chair (including a wheelchair)?: A Little ?Help needed standing up from a chair using your arms (e.g., wheelchair or bedside chair)?: A Little ?Help needed to walk in hospital room?: A Little ?Help needed climbing 3-5 steps with a railing? : Total ?6 Click Score: 17 ? ?  ?End of Session Equipment Utilized During Treatment: Gait belt ?Activity Tolerance: Patient limited by fatigue ?Patient left: in bed;with call bell/phone within reach ?Nurse Communication: Mobility status ?PT Visit Diagnosis: Other abnormalities of gait and mobility (R26.89);History of falling (Z91.81);Muscle weakness (generalized) (M62.81) ?  ? ?Time: 2505-3976 ?PT Time Calculation (min) (ACUTE ONLY): 17 min ? ? ?Charges:   PT Evaluation ?$  PT Eval Moderate Complexity: 1 Mod ?  ?  ?   ? ? ? ?Jerolyn Center, PT ?Acute Rehabilitation Services  ?Pager [phone] ?Office [phone] ? ? ?Scherrie November Shavontae Gibeault ?06/28/2021, 2:17 PM ? ?

## 2021-06-28 NOTE — Progress Notes (Signed)
Pt stable from OB stand point.   ? ?Vitals:  ? 06/27/21 1143 06/27/21 1548 06/27/21 1955 06/27/21 2335  ?BP: 116/60 136/71 127/65 113/60  ?Pulse: 80 93 90 82  ?Resp: 18 18 16 17   ?Temp: 98.2 ?F (36.8 ?C) 98.1 ?F (36.7 ?C) 98.6 ?F (37 ?C) 98.1 ?F (36.7 ?C)  ?TempSrc: Oral Oral Oral Oral  ?SpO2: 99% 100% 100% 99%  ?Weight:      ?Height:      ?FHTs once a day, yesterday 148 ? ?A/P Neuro taking care of pt.  Pt voiding better.  D/w them that primary concern at this point is for mother; however, I believe that MRI contrast risk and lumbar puncture risks are very low to fetus.   ? ?

## 2021-06-28 NOTE — Progress Notes (Signed)
In regards to pt's ordered MRIs, I discussed the potential for administration of MRI contrast with Dr Phill Myron (Radiologist) due to pt's pregnancy. Per Dr Phill Myron, pt ok to receive MRI contrast with informed consent. Dr Oleta Mouse (On-site) provided informed consent to the pt, witnessed by myself (tech performing ordered scans). Pt signed screening/contrast form as well as informed consent form, both of which scanned into chart. Proceeded as determined by radiologist approval and with noted support of pt's care team including both ordering provider (Dr Derry Lory) and Ob (Dr Henderson Cloud). ?

## 2021-06-28 NOTE — Progress Notes (Signed)
NEUROLOGY CONSULTATION PROGRESS NOTE  ? ?Date of service: June 28, 2021 ?Patient Name: [name] ?MRN:  [id number] ?DOB:  [birth year] ? ?Brief HPI  ? ?41 yo F with optic neuritis on the right as well as symptoms highly concerning for transverse myelitis.  These two findings occurring concurrently would be very worrisome for neuromyelitis optica. MRI brain with 1.5 cm focus of abnormal T2/FLAIR signal abnormality involving the right middle cerebellar peduncle extending into the right lateral pons. Finding is indeterminate, but favored to reflect a focal area of demyelination. Normal MRI appearance of the cervical and thoracic spinal cord. No evidence for demyelinating disease. MRI with contrast confirms R optic neuritis and also notable for subtle enhancement of the R brachium pontis. ?  ?Interval Hx  ? ?Vision shows a little improvement, numbness/tingling with pain in BL lower extremities is much better today. ? ?Vitals  ? ?Vitals:  ? 06/27/21 1548 06/27/21 1955 06/27/21 2335 06/28/21 0905  ?BP: 136/71 127/65 113/60 116/63  ?Pulse: 93 90 82 88  ?Resp: _0 ?Temp: 98.1 ?F (36.7 ?C) 98.6 ?F (37 ?C) 98.1 ?F (36.7 ?C) 98.1 ?F (36.7 ?C)  ?TempSrc: Oral Oral Oral Oral  ?SpO2: 100% 100% 99% 100%  ?Weight:      ?Height:      ?  ? ?Body mass index is 30.23 kg/m?. ? ?Physical Exam  ? ?General: Laying comfortably in bed; in no acute distress.  ?HENT: Normal oropharynx and mucosa. Normal external appearance of ears and nose.  ?Neck: Supple, no pain or tenderness  ?CV: No JVD. No peripheral edema.  ?Pulmonary: Symmetric Chest rise. Normal respiratory effort.  ?Abdomen: Soft to touch, non-tender.  ?Ext: No cyanosis, edema, or deformity  ?Skin: No rash. Normal palpation of skin.   ?Musculoskeletal: Normal digits and nails by inspection. No clubbing.  ? ?Neurologic Examination  ?Mental status/Cognition: Alert, oriented to self, place, month and year, good attention.  ?Speech/language: Fluent, comprehension intact,  object naming intact, repetition intact.  ?Cranial nerves:  ? CN II Pupils equal and reactive to light, visual acuity in R eye is improved but still significantly decreased with central vision being worse than peripheral. No afferent pupillar defect.  ? CN III,IV,VI EOM intact, no gaze preference or deviation, no nystagmus  ? CN V normal sensation in V1, V2, and V3 segments bilaterally   ? CN VII no asymmetry, no nasolabial fold flattening   ? CN VIII normal hearing to speech   ? CN IX & X normal palatal elevation, no uvular deviation   ? CN XI 5/5 head turn and 5/5 shoulder shrug bilaterally   ? CN XII midline tongue protrusion   ? ?Motor:  ?Muscle bulk: normal, tone normal ?Mvmt Root Nerve  Muscle Right Left Comments  ?SA C5/6 Ax Deltoid 5 5   ?EF C5/6 Mc Biceps 5 5   ?EE C6/7/8 Rad Triceps 5 5   ?WF C6/7 Med FCR     ?WE C7/8 PIN ECU     ?F Ab C8/T1 U ADM/FDI 5 5   ?HF L1/2/3 Fem Illopsoas 4 4+   ?KE L2/3/4 Fem Quad 5 5   ?DF L4/5 D Peron Tib Ant 5 5   ?PF S1/2 Tibial Grc/Sol 5 5   ? ?Reflexes: ? Right Left Comments  ?Pectoralis     ? Biceps (C5/6) 2 2   ?Brachioradialis (C5/6) 2 2   ? Triceps (C6/7) 2 2   ? Patellar (L3/4) 3 3 Some ?spread of  patellar reflex.  ? Achilles (S1) 2 2   ? Hoffman     ? Plantar down down   ?Jaw jerk   ? ?Sensation: ? Light touch Tightness in her thigh and feels warmer to touch in RLE but otherwise intact.  ? Pin prick   ? Temperature   ? Vibration   ?Proprioception   ? ?Coordination/Complex Motor:  ?- Finger to Nose intact BL ?- Heel to shin unable to do  ?- Rapid alternating movement are normal ?- Gait: Deferred for patient safety. ? ?Labs  ? ?Basic Metabolic Panel:  ?Lab Results  ?Component Value Date  ? NA 136 06/26/2021  ? K 3.7 06/26/2021  ? CO2 23 06/26/2021  ? GLUCOSE 95 06/26/2021  ? BUN 14 06/26/2021  ? CREATININE 0.49 06/26/2021  ? CALCIUM 9.0 06/26/2021  ? GFRNONAA >60 06/26/2021  ? GFRAA  05/15/2009  ?  >60        ?The eGFR has been calculated ?using the MDRD  equation. ?This calculation has not been ?validated in all clinical ?situations. ?eGFR's persistently ?<60 mL/min signify ?possible Chronic Kidney Disease.  ? ?HbA1c: No results found for: HGBA1C ?LDL:  ?Lab Results  ?Component Value Date  ? [place] 81 12/31/2011  ? ?Urine Drug Screen: No results found for: LABOPIA, COCAINSCRNUR, [place], [place], THCU, LABBARB  ?Alcohol Level No results found for: ETH ?No results found for: PHENYTOIN, ZONISAMIDE, LAMOTRIGINE, LEVETIRACETA ?No results found for: PHENYTOIN, PHENOBARB, VALPROATE, CBMZ ? ?Imaging and Diagnostic studies  ? ?MRI HEAD IMPRESSION: ? ?1. 1.5 cm focus of abnormal T2/FLAIR signal abnormality involving ?the right middle cerebellar peduncle extending into the right ?lateral pons. Finding is indeterminate, but favored to reflect a ?focal area of demyelination. Possible changes of subacute ischemia ?or mass/glioma would be the primary differential considerations, ?although felt to be less likely. No significant mass effect. ?2. Single additional punctate 3 mm focus of FLAIR hyperintensity ?involving the subcortical right frontal lobe, indeterminate. ?3. Otherwise normal brain MRI. ? ?MRI CERVICAL AND THORACIC SPINE IMPRESSION: ? ?1. Normal MRI appearance of the cervical and thoracic spinal cord. ?No evidence for demyelinating disease. ?2. Mild degenerative spondylosis at C4-5 and C5-6 without ?significant stenosis or neural impingement. ? ?MRI Orbits with and without contrast: ?1. Abnormal T2 hyperintensity and enhancement of the right optic ?nerve extending beyond the orbital apex up to the chiasm. This is ?consistent with optic neuritis. ?2. Focal area of T2 hyperintensity in the right cerebellar peduncle ?with subtle enhancement. ?3. 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 ? ?MRI brain with contrast: ?1. The subtle focus of enhancement in the right brachium pontis ?demonstrated on the orbit MRI is not appreciated on this study. ?2. No  other focal enhancement of the brain ?3. Right optic nerve enhancement. Findings consistent with optic ?neuritis. Please see full discussion in the report of the MRI ?orbits. ? ?MRI C and T spine with contrast: ?1. Normal contrast-enhanced MRI of the cervical and thoracic spine. ?No evidence of transverse myelitis or demyelinating disease. ? ? ? ?Impression  ? ?41 yo F with optic neuritis on the right as well as symptoms concerning for transverse myelitis on clinical exam with allodynia, BL lower extremity weakness and numbness with a sensory level initially at T8. 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 ? ?Differential includes: Neuromyelitis optica, MS, Neurosarcoidosis. Less likely tuberculosis, systemic autoimmune disease  with secondary neurological involvement. ? ?Recommendations  ?- NMO Panel, MOG Ab, ANA, ACE, IL2R, RPR pending. ?- continue Solumedrol 1G Daily x 5 doses. If no significant improvement after 3rd day of steroids, will need to start preparing for PLEX. ?- Gabapentin 161m TID PRN for neuropathic pain ?- Will hold off on LP at this time. Given improvement, unlikely that LP will change the treatment course at this time. ?___________________________________________________________________ ? ?Thank you for the opportunity to take part in the care of this patient. If you have any further questions, please contact the neurology consultation attending. ? ?Signed, ? ?SDonnetta Simpers?[facility] ?Pager Number [phone]? ?

## 2021-06-29 DIAGNOSIS — H469 Unspecified optic neuritis: Secondary | ICD-10-CM | POA: Diagnosis not present

## 2021-06-29 LAB — MISC LABCORP TEST (SEND OUT)

## 2021-06-29 LAB — ANGIOTENSIN CONVERTING ENZYME: Angiotensin-Converting Enzyme: 55 U/L (ref 14–82)

## 2021-06-29 LAB — ANA: Anti Nuclear Antibody (ANA): NEGATIVE

## 2021-06-29 LAB — RPR: RPR Ser Ql: NONREACTIVE

## 2021-06-29 LAB — NEUROMYELITIS OPTICA AUTOAB, IGG: NMO-IgG: <1.5 U/mL (ref 0.0–3.0)

## 2021-06-29 MED ORDER — SODIUM CHLORIDE 0.9% FLUSH
3.0000 mL | Freq: Two times a day (BID) | INTRAVENOUS | Status: DC
  Administered 2021-06-29 – 2021-07-01 (×4): 3 mL via INTRAVENOUS

## 2021-06-29 NOTE — Evaluation (Signed)
Occupational Therapy Evaluation Patient Details Name: [name] MRN: [id number] DOB: 1980-10-25 Today's Date: 06/29/2021   History of Present Illness 41 y.o. female who presented to [facility]U 06/27/21 at 74 5/[redacted]wks pregnant with complaint of weakness in LE, inability to walk or void without assistance, loss of vision and pain in her right eye, and painful touch diffusely over her skin. 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 MRI without constrast: rt middle cerebellar peduncle into rt lateral pons focal area of demyelination; normal cervical and thoracic spine.   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 Currently requires min A with ADL and mobility @ RW level due to deficits listed below. Began education on compensatory strategies for low vision and reducing eye fatigue, in addition to strategies for reducing risk of falls. Will follow acutely to facilitate safe DC home but do not anticipate the need for OT follow up after DC.      Recommendations for follow up therapy are one component of a multi-disciplinary discharge planning process, led by the attending physician.  Recommendations may be updated based on patient status, additional functional criteria and insurance authorization.   Follow Up Recommendations  No OT follow up    Assistance Recommended at Discharge Intermittent Supervision/Assistance  Patient can return home with the following A little help with walking and/or transfers;A little help with bathing/dressing/bathroom;Assistance with cooking/housework;Help with stairs or ramp for entrance    Functional Status Assessment  Patient has had a recent decline in their functional status and demonstrates the ability to make significant improvements in function in a reasonable and predictable amount of time.  Equipment Recommendations  BSC/3in1    Recommendations  for Other Services       Precautions / Restrictions Precautions Precautions: Fall      Mobility Bed Mobility Overal bed mobility: Needs Assistance Bed Mobility: Supine to Sit, Sit to Supine     Supine to sit: Supervision, HOB elevated Sit to supine: Min assist   General bed mobility comments: min assist to raise legs up onto bed    Transfers Overall transfer level: Needs assistance Equipment used: Rolling walker (2 wheels) Transfers: Sit to/from Stand Sit to Stand: Min assist           General transfer comment: vc for safe use of RW; steadying assist upon standing with mild dizziness      Balance Overall balance assessment: Needs assistance Sitting-balance support: No upper extremity supported, Feet supported Sitting balance-Leahy Scale: Good     Standing balance support: Bilateral upper extremity supported, Reliant on assistive device for balance Standing balance-Leahy Scale: Poor Standing balance comment: reliant on external support                           ADL either performed or assessed with clinical judgement   ADL Overall ADL's : Needs assistance/impaired     Grooming: Set up   Upper Body Bathing: Set up;Sitting   Lower Body Bathing: Minimal assistance;Sit to/from stand   Upper Body Dressing : Set up;Sitting   Lower Body Dressing: Minimal assistance;Sit to/from stand   Toilet Transfer: Min guard;Rolling walker (2 wheels);Ambulation   Toileting- Clothing Manipulation and Hygiene: Min guard;Sit to/from stand       Functional mobility during ADLs: Minimal assistance;Rolling walker (2 wheels);Cueing for safety General ADL Comments: Discussed  use of 3in1 over toilet; reacher  to reduce need to pick itmes up/bag tied on RW to carry items; moving throw rugs; items at counter top level     Vision Baseline Vision/History: 1 Wears glasses Ability to See in Adequate Light: 1 Impaired Patient Visual Report: Blurring of vision (R  eye) Vision Assessment?: Yes Eye Alignment: Within Functional Limits (? strabismus R eye) Ocular Range of Motion: Within Functional Limits Alignment/Gaze Preference: Within Defined Limits Tracking/Visual Pursuits: Decreased smoothness of horizontal tracking;Decreased smoothness of vertical tracking Saccades: Additional eye shifts occurred during testing Additional Comments: "gray curtain over my R eye"     Perception     Praxis      Pertinent Vitals/Pain Pain Assessment Pain Assessment: No/denies pain     Hand Dominance Left   Extremity/Trunk Assessment Upper Extremity Assessment Upper Extremity Assessment: Overall WFL for tasks assessed   Lower Extremity Assessment Lower Extremity Assessment: Defer to PT evaluation   Cervical / Trunk Assessment Cervical / Trunk Assessment: Other exceptions Cervical / Trunk Exceptions: numbness lower torso   Communication Communication Communication: No difficulties   Cognition Arousal/Alertness: Awake/alert Behavior During Therapy: WFL for tasks assessed/performed Overall Cognitive Status: Within Functional Limits for tasks assessed                                       General Comments  Began educaiton on compensatory strategies for low vision regarding lighting, contrast and decreasing clutter; phone adjusted to increase font which appeared to help significantly' discussed other modifications regarding brightness and using bold font to increase contrast; educated on strategies to reduce risk of falls    Exercises Exercises: Other exercises Other Exercises Other Exercises: encouraged feel cord stretches with sheet   Shoulder Instructions      Home Living Family/patient expects to be discharged to:: Private residence Living Arrangements: Spouse/significant other Available Help at Discharge: Family;Available PRN/intermittently (but can have family come stay if needed (husband works close by to home)) Type of Home:  House Home Access: Stairs to enter Secretary/administrator of Steps: 1 Entrance Stairs-Rails: None Home Layout: Two level;1/2 bath on main level;Bed/bath upstairs (has been sleeping on couch; husband assisting up to shower, but recently fell going up the steps) Alternate Level Stairs-Number of Steps: flight Alternate Level Stairs-Rails: Right Bathroom Shower/Tub: Tub/shower unit (upstairs)   Bathroom Toilet: Standard Bathroom Accessibility: Yes How Accessible: Accessible via walker Home Equipment: Rolling Walker (2 wheels)   Additional Comments: RW from deceased family member      Prior Functioning/Environment Prior Level of Function : Independent/Modified Independent             Mobility Comments: 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[employer]; desk job/computer work)          OT Problem List: Decreased strength;Impaired balance (sitting and/or standing);Decreased safety awareness;Decreased knowledge of use of DME or AE;Impaired sensation;Impaired vision/perception      OT Treatment/Interventions: Self-care/ADL training;Therapeutic exercise;DME and/or AE instruction;Therapeutic activities;Patient/family education;Balance training    OT Goals(Current goals can be found in the care plan section) Acute Rehab OT Goals Patient Stated Goal: to get stronger OT Goal Formulation: With patient Time For Goal Achievement: 07/13/21 Potential to Achieve Goals: Good  OT Frequency: Min 2X/week    Co-evaluation              AM-PAC OT "6 Clicks" Daily Activity     Outcome Measure Help from another person eating meals?: None Help  from another person taking care of personal grooming?: A Little Help from another person toileting, which includes using toliet, bedpan, or urinal?: A Little Help from another person bathing (including washing, rinsing, drying)?: A Little Help from another person to put on and taking off regular upper body clothing?: A Little Help from  another person to put on and taking off regular lower body clothing?: A Little 6 Click Score: 19   End of Session Equipment Utilized During Treatment: Gait belt;Rolling walker (2 wheels) Nurse Communication: Mobility status  Activity Tolerance: Patient tolerated treatment well Patient left: in bed;with call bell/phone within reach  OT Visit Diagnosis: Unsteadiness on feet (R26.81);Other abnormalities of gait and mobility (R26.89);History of falling (Z91.81);Muscle weakness (generalized) (M62.81);Low vision, both eyes (H54.2) (low vision R eye)                Time: 6045-4098 OT Time Calculation (min): 31 min Charges:  OT General Charges $OT Visit: 1 Visit OT Evaluation $OT Eval Moderate Complexity: 1 Mod OT Treatments $Self Care/Home Management : 8-22 mins  Luisa Dago, OT/L   Acute OT Clinical Specialist Acute Rehabilitation Services Pager [phone] Office [phone]   [facility] 06/29/2021, 8:55 AM

## 2021-06-29 NOTE — Progress Notes (Addendum)
NEUROLOGY CONSULTATION PROGRESS NOTE  ? ?Date of service: June 29, 2021 ?Patient Name: [name] ?MRN:  [id number] ?DOB:  27-Jul-1980 ? ?Brief HPI  ? ?41 yo F with optic neuritis on the right as well as symptoms highly concerning for transverse myelitis.  These two findings occurring concurrently would be very worrisome for neuromyelitis optica. MRI brain with 1.5 cm focus of abnormal T2/FLAIR signal abnormality involving the right middle cerebellar peduncle extending into the right lateral pons. Finding is indeterminate, but favored to reflect a focal area of demyelination. Normal MRI appearance of the cervical and thoracic spinal cord. No evidence for demyelinating disease. MRI with contrast confirms R optic neuritis and also notable for subtle enhancement of the R brachium pontis. ?  ?Interval Hx  ? ?OD pain is gone. ?OD vision appears to be improving.  ?[place] and diminished color in OD still present, unchanged from yesterday.  ?Strength in BLE improved.  ?Voiding difficulty resolved-described having to bear down to empty bladder.  ? ?During her last pregnancy with 41yo, she remembered LE weakness that was not as bad and self-resolved. No vision symptoms in the past.  ?She has ran 7 marathons! ? ?Vitals  ? ?Vitals:  ? 06/28/21 1951 06/28/21 2313 06/29/21 0321 06/29/21 0758  ?BP: (!) 118/54 (!) 118/56 (!) 106/49 118/61  ?Pulse: 74 92 66 88  ?Resp: _0 ?Temp: 98.1 ?F (36.7 ?C) 98 ?F (36.7 ?C) 97.7 ?F (36.5 ?C) 98.1 ?F (36.7 ?C)  ?TempSrc: Oral Oral Oral Oral  ?SpO2: 99% 99% 99% 99%  ?Weight:      ?Height:      ?  ? ?Body mass index is 30.23 kg/m?. ? ?Physical Exam  ? ?General: Laying comfortably in bed; in no acute distress.  ?HENT: Normal oropharynx and mucosa. Normal external appearance of ears and nose.  ?Neck: Supple, no pain or tenderness  ?CV: No peripheral edema.  ?Pulmonary: Symmetric Chest rise. Normal respiratory effort.  ?Abdomen: Soft to touch, non-tender.  ?Ext: No cyanosis, edema, or  deformity  ?Skin: No rash. Normal palpation of skin.   ?Musculoskeletal: Normal digits and nails by inspection. No clubbing.  ? ?Neurologic Examination  ?Mental status/Cognition: Alert, oriented to self, place, month and year, good attention.  ?Speech/language: Fluent, comprehension intact, object naming intact, repetition intact.  ?Cranial nerves:  ? CN II Pupils equal and reactive to light, visual acuity in R eye is improved but still significantly decreased with central vision being worse than peripheral.  ?No afferent pupillar defect. ?Decreased color vision - "gray curtain"  ? CN III,IV,VI EOM intact, no gaze preference or deviation, no nystagmus  ? CN V normal sensation in V1, V2, and V3 segments bilaterally   ? CN VII no asymmetry, no nasolabial fold flattening   ? CN VIII normal hearing to speech   ? CN IX & X normal palatal elevation, no uvular deviation   ? CN XI 5/5 head turn and 5/5 shoulder shrug bilaterally   ? CN XII midline tongue protrusion   ? ?Motor:  ?Muscle bulk: normal, tone normal ?Mvmt Root Nerve  Muscle Right Left Comments  ?SA C5/6 Ax Deltoid 5 5   ?EF C5/6 Mc Biceps 5 5   ?EE C6/7/8 Rad Triceps 5 5   ?WF C6/7 Med FCR     ?WE C7/8 PIN ECU     ?F Ab C8/T1 U ADM/FDI 5 5   ?HF L1/2/3 Fem Illopsoas 4 5 Unable to bring R leg as  high, and unable to hold R leg in air >5s  ?KE L2/3/4 Fem Quad 5 5   ?DF L4/5 D Peron Tib Ant 5 5   ?PF S1/2 Tibial Grc/Sol 5 5   ? ?Reflexes: ? Right Left Comments  ?Pectoralis     ? Biceps (C5/6) 2 2   ?Brachioradialis (C5/6) 2 2   ? Triceps (C6/7) 2 2   ? Patellar (L3/4) 1 1   ? Achilles (S1) 1 2   ? Hoffman     ? Plantar down down   ?Jaw jerk   ? ?Sensation: ? Light touch T8/T9 - Duller sensations that wraps around, right above umbilical.   ? Pin prick   ? Temperature   ? Vibration   ?Proprioception   ? ?Coordination/Complex Motor:  ?- Finger to Nose intact BL ?- Heel to shin intact BL ?- Rapid alternating movement are normal ?- Gait: Deferred for patient  safety. ? ?Labs  ? ?Basic Metabolic Panel:  ?Lab Results  ?Component Value Date  ? NA 136 06/26/2021  ? K 3.7 06/26/2021  ? CO2 23 06/26/2021  ? GLUCOSE 95 06/26/2021  ? BUN 14 06/26/2021  ? CREATININE 0.49 06/26/2021  ? CALCIUM 9.0 06/26/2021  ? GFRNONAA >60 06/26/2021  ? GFRAA  05/15/2009  ?  >60        ?The eGFR has been calculated ?using the MDRD equation. ?This calculation has not been ?validated in all clinical ?situations. ?eGFR's persistently ?<60 mL/min signify ?possible Chronic Kidney Disease.  ? ?HbA1c: No results found for: HGBA1C ?LDL:  ?Lab Results  ?Component Value Date  ? [place] 81 12/31/2011  ? ?Urine Drug Screen: No results found for: LABOPIA, COCAINSCRNUR, [place], [place], THCU, LABBARB  ?Alcohol Level No results found for: ETH ?No results found for: PHENYTOIN, ZONISAMIDE, LAMOTRIGINE, LEVETIRACETA ?No results found for: PHENYTOIN, PHENOBARB, VALPROATE, CBMZ ? ?Imaging and Diagnostic studies  ? ?MRI HEAD IMPRESSION: ? ?1. 1.5 cm focus of abnormal T2/FLAIR signal abnormality involving ?the right middle cerebellar peduncle extending into the right ?lateral pons. Finding is indeterminate, but favored to reflect a ?focal area of demyelination. Possible changes of subacute ischemia ?or mass/glioma would be the primary differential considerations, ?although felt to be less likely. No significant mass effect. ?2. Single additional punctate 3 mm focus of FLAIR hyperintensity ?involving the subcortical right frontal lobe, indeterminate. ?3. Otherwise normal brain MRI. ? ?MRI CERVICAL AND THORACIC SPINE IMPRESSION: ? ?1. Normal MRI appearance of the cervical and thoracic spinal cord. ?No evidence for demyelinating disease. ?2. Mild degenerative spondylosis at C4-5 and C5-6 without ?significant stenosis or neural impingement. ? ?MRI Orbits with and without contrast: ?1. Abnormal T2 hyperintensity and enhancement of the right optic ?nerve extending beyond the orbital apex up to the chiasm. This  is ?consistent with optic neuritis. ?2. Focal area of T2 hyperintensity in the right cerebellar peduncle ?with subtle enhancement. ?3. 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 ? ?MRI brain with contrast: ?1. The subtle focus of enhancement in the right brachium pontis ?demonstrated on the orbit MRI is not appreciated on this study. ?2. No other focal enhancement of the brain ?3. Right optic nerve enhancement. Findings consistent with optic ?neuritis. Please see full discussion in the report of the MRI ?orbits. ? ?MRI C and T spine with contrast: ?1. Normal contrast-enhanced MRI of the cervical and thoracic spine. ?No evidence of transverse myelitis or demyelinating disease. ? ? ? ?Impression  ? ?41 yo F with optic neuritis on the right  as well as symptoms concerning for transverse myelitis on clinical exam with allodynia, BL lower extremity weakness and numbness with a sensory level initially at T8. 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 ? ?Improvement in sxs on day 3 of IV steroids, will hold off on PLEX at this time.  ? ?Differential includes: Neuromyelitis optica, MS, Neurosarcoidosis. Less likely tuberculosis, systemic autoimmune disease with secondary neurological involvement. ? ?Recommendations  ?- NMO Panel ([facility]), MOG Ab ([facility]), ANA, ACE, IL2R, RPR pending. ?- Continue Solumedrol 1G Daily x 5 doses, no PLEX at this time. If day 5 (last day) still significant deficits, will consider PLEX.   ?- Gabapentin 115m TID PRN for neuropathic pain ?- Will hold off on LP at this time. Given improvement, unlikely that LP will change the treatment course at this time. ?- [facility]  ?___________________________________________________________________ ? ?Thank you for the opportunity to  take part in the care of this patient. If you have any further questions, please contact the neurology consultation attending. ? ?Signed: ?JMerrily Brittle DO ?Resident, PGY-1 ?[facility]?06/29/2021, 8:44 AM

## 2021-06-29 NOTE — Progress Notes (Signed)
[redacted]W[redacted]D ?Feeling FM, no VB, LOF or ctx ?+FHT last pm ?Continue management per neurology ?

## 2021-06-29 NOTE — Progress Notes (Signed)
Physical Therapy Treatment ?Patient Details ?Name: [name] ?MRN: [id number] ?DOB: 06-22-1980 ?Today's Date: 06/29/2021 ? ? ?History of Present Illness 41 y.o. female who presented to [facility]U 06/27/21 at 47 5/[redacted]wks pregnant with complaint of weakness in LE, inability to walk or void without assistance, loss of vision and pain in her right eye, and painful touch diffusely over her skin. 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 MRI without constrast: rt middle cerebellar peduncle into rt lateral pons focal area of demyelination; normal cervical and thoracic spine. ? ?  ?PT Comments  ? ? Patient with notably improved LE strength when observing her gait. Able to increase walking distance to 100 ft, took a seated rest, and again 100 ft. Educated in LE exercises and how to pace her exercise/activity so as not to cause a setback. Patient very receptive to education.  ?   ?Recommendations for follow up therapy are one component of a multi-disciplinary discharge planning process, led by the attending physician.  Recommendations may be updated based on patient status, additional functional criteria and insurance authorization. ? ?Follow Up Recommendations ? Home health PT ?  ?  ?Assistance Recommended at Discharge Intermittent Supervision/Assistance  ?Patient can return home with the following A little help with walking and/or transfers;A little help with bathing/dressing/bathroom;Assistance with cooking/housework;Help with stairs or ramp for entrance ?  ?Equipment Recommendations ? BSC/3in1  ?  ?Recommendations for Other Services   ? ? ?  ?Precautions / Restrictions Precautions ?Precautions: Fall ?Restrictions ?Weight Bearing Restrictions: No  ?  ? ?Mobility ? Bed Mobility ?Overal bed mobility: Needs Assistance ?Bed Mobility: Supine to Sit, Sit to Supine ?  ?  ?Supine to sit: HOB elevated, Modified independent (Device/Increase time) ?Sit to supine: Modified independent (Device/Increase time) ?   ?General bed mobility comments: HOB elevated, no physical assist needed ?  ? ?Transfers ?Overall transfer level: Needs assistance ?Equipment used: Rolling walker (2 wheels) ?Transfers: Sit to/from Stand ?Sit to Stand: Min assist ?  ?  ?  ?  ?  ?General transfer comment: vc for safe use of RW; assist from low surface (bench in hallway) ?  ? ?Ambulation/Gait ?Ambulation/Gait assistance: Min guard ?Gait Distance (Feet): 100 Feet (seated rest; 100) ?Assistive device: Rolling walker (2 wheels) ?Gait Pattern/deviations: Step-through pattern, Decreased stride length ?  ?  ?  ?General Gait Details: pt much less shakey than 4/16 and better able to lighten how much pressure she carries on her hands via RW ? ? ?Stairs ?  ?  ?  ?  ?  ? ? ?Wheelchair Mobility ?  ? ?Modified Rankin (Stroke Patients Only) ?  ? ? ?  ?Balance Overall balance assessment: Needs assistance ?Sitting-balance support: No upper extremity supported, Feet supported ?Sitting balance-Leahy Scale: Good ?  ?  ?Standing balance support: No upper extremity supported ?Standing balance-Leahy Scale: Fair ?Standing balance comment: able to stand at sink and wash hands without support ?  ?  ?  ?  ?  ?  ?  ?  ?  ?  ?  ?  ? ?  ?Cognition Arousal/Alertness: Awake/alert ?Behavior During Therapy: [facility] for tasks assessed/performed ?Overall Cognitive Status: Within Functional Limits for tasks assessed ?  ?  ?  ?  ?  ?  ?  ?  ?  ?  ?  ?  ?  ?  ?  ?  ?  ?  ?  ? ?  ?Exercises Low Level/ICU Exercises ?Stabilized Bridging: AROM, Both, 5 reps ?Other Exercises ?  Other Exercises: coordination ex's bil LE (heel to shin and then heel to knee and jump to ankle); LLE with greater dyscoordination compared to RLE ? ?  ?General Comments General comments (skin integrity, edema, etc.): Patient asking about what drives MS exacerbations and discussed how overdoing activity (especially during a "flare" can prolong/worsen her strength; discussed heat as a trigger. ?  ?  ? ?Pertinent Vitals/Pain  Pain Assessment ?Pain Assessment: No/denies pain  ? ? ?Home Living   ?  ?  ?  ?  ?  ?  ?  ?  ?  ?   ?  ?Prior Function    ?  ?  ?   ? ?PT Goals (current goals can now be found in the care plan section) Acute Rehab PT Goals ?Patient Stated Goal: return to normal activities ?Time For Goal Achievement: 07/12/21 ?Potential to Achieve Goals: Good ?Progress towards PT goals: Progressing toward goals ? ?  ?Frequency ? ? ? Min 5X/week ? ? ? ?  ?PT Plan Current plan remains appropriate  ? ? ?Co-evaluation   ?  ?  ?  ?  ? ?  ?AM-PAC PT "6 Clicks" Mobility   ?Outcome Measure ? Help needed turning from your back to your side while in a flat bed without using bedrails?: None ?Help needed moving from lying on your back to sitting on the side of a flat bed without using bedrails?: None ?Help needed moving to and from a bed to a chair (including a wheelchair)?: A Little ?Help needed standing up from a chair using your arms (e.g., wheelchair or bedside chair)?: A Little ?Help needed to walk in hospital room?: A Little ?Help needed climbing 3-5 steps with a railing? : Total ?6 Click Score: 18 ? ?  ?End of Session   ?Activity Tolerance: Patient tolerated treatment well ?Patient left: in bed;with call bell/phone within reach ?  ?PT Visit Diagnosis: Other abnormalities of gait and mobility (R26.89);History of falling (Z91.81);Muscle weakness (generalized) (M62.81) ?  ? ? ?Time: 1439-1510 ?PT Time Calculation (min) (ACUTE ONLY): 31 min ? ?Charges:  $Gait Training: 8-22 mins ?$Therapeutic Exercise: 8-22 mins          ?          ? ? ?Jerolyn Center, PT ?Acute Rehabilitation Services  ?Pager [phone] ?Office [phone] ? ? ? ?[name] ?06/29/2021, 3:23 PM ? ?

## 2021-06-30 ENCOUNTER — Encounter (HOSPITAL_COMMUNITY): Payer: Self-pay | Admitting: Obstetrics and Gynecology

## 2021-06-30 ENCOUNTER — Ambulatory Visit: Payer: 59 | Admitting: Physical Therapy

## 2021-06-30 DIAGNOSIS — H469 Unspecified optic neuritis: Secondary | ICD-10-CM | POA: Diagnosis not present

## 2021-06-30 LAB — MISC LABCORP TEST (SEND OUT): Labcorp test code: 142455

## 2021-06-30 MED ORDER — PHENYLEPHRINE HCL-NACL 20-0.9 MG/250ML-% IV SOLN
INTRAVENOUS | Status: AC
  Filled 2021-06-30: qty 500

## 2021-06-30 MED ORDER — PROPOFOL 1000 MG/100ML IV EMUL
INTRAVENOUS | Status: AC
  Filled 2021-06-30: qty 100

## 2021-06-30 NOTE — Progress Notes (Signed)
Physical Therapy Treatment ?Patient Details ?Name: [name] ?MRN: [id number] ?DOB: 1980-06-20 ?Today's Date: 06/30/2021 ? ? ?History of Present Illness 41 y.o. female who presented to [facility]U 06/27/21 at 61 5/[redacted]wks pregnant with complaint of weakness in LE, inability to walk or void without assistance, loss of vision and pain in her right eye, and painful touch diffusely over her skin. 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 MRI without constrast: rt middle cerebellar peduncle into rt lateral pons focal area of demyelination; normal cervical and thoracic spine. ? ?  ?PT Comments  ? ? Patient reports continued improvement in LE strength. Able to progress to stair training with pt able to step up/down on 6" step x 8 reps with simulated rail and minguard assist. Notified pt and nursing that she is cleared to walk in hallway with walker and family or nursing (provide supervision for safety).  ?   ?Recommendations for follow up therapy are one component of a multi-disciplinary discharge planning process, led by the attending physician.  Recommendations may be updated based on patient status, additional functional criteria and insurance authorization. ? ?Follow Up Recommendations ? Home health PT ?  ?  ?Assistance Recommended at Discharge Intermittent Supervision/Assistance  ?Patient can return home with the following A little help with walking and/or transfers;A little help with bathing/dressing/bathroom;Assistance with cooking/housework;Help with stairs or ramp for entrance ?  ?Equipment Recommendations ? None recommended by PT  ?  ?Recommendations for Other Services   ? ? ?  ?Precautions / Restrictions Precautions ?Precautions: Fall ?Restrictions ?Weight Bearing Restrictions: No  ?  ? ?Mobility ? Bed Mobility ?Overal bed mobility: Needs Assistance ?Bed Mobility: Supine to Sit, Sit to Supine ?  ?  ?Supine to sit: HOB elevated, Modified independent (Device/Increase time) ?Sit to supine:  Modified independent (Device/Increase time) ?  ?General bed mobility comments: HOB elevated, no physical assist needed ?  ? ?Transfers ?Overall transfer level: Needs assistance ?Equipment used: Rolling walker (2 wheels) ?Transfers: Sit to/from Stand ?Sit to Stand: Modified independent (Device/Increase time) ?  ?  ?  ?  ?  ?General transfer comment: no cues needed; stood from bed and toilet ?  ? ?Ambulation/Gait ?Ambulation/Gait assistance: Supervision ?Gait Distance (Feet): 15 Feet (toileted; 25) ?Assistive device: Rolling walker (2 wheels) ?Gait Pattern/deviations: Step-through pattern, Decreased stride length ?  ?Gait velocity interpretation: 1.31 - 2.62 ft/sec, indicative of limited community ambulator ?  ?General Gait Details: completed after doing step training and legs slightly shakey; supervision for safety; pt with safe use of RW without cues ? ? ?Stairs ?Stairs: Yes ?Stairs assistance: Min guard ?Stair Management: One rail Right, Step to pattern, Sideways ?Number of Stairs: 8 (single step x 8 reps; using bed rail at [facility] as "stair rail") ?  ? ? ?Wheelchair Mobility ?  ? ?Modified Rankin (Stroke Patients Only) ?  ? ? ?  ?Balance Overall balance assessment: Needs assistance ?Sitting-balance support: No upper extremity supported, Feet supported ?Sitting balance-Leahy Scale: Good ?  ?  ?Standing balance support: No upper extremity supported ?Standing balance-Leahy Scale: Fair ?Standing balance comment: able to stand at sink and wash hands without support ?  ?  ?  ?  ?  ?  ?  ?  ?  ?  ?  ?  ? ?  ?Cognition Arousal/Alertness: Awake/alert ?Behavior During Therapy: [facility] for tasks assessed/performed ?Overall Cognitive Status: Within Functional Limits for tasks assessed ?  ?  ?  ?  ?  ?  ?  ?  ?  ?  ?  ?  ?  ?  ?  ?  ?  ?  ?  ? ?  ?  Exercises   ? ?  ?General Comments General comments (skin integrity, edema, etc.): Patient reports she has been doing her exercises ?  ?  ? ?Pertinent Vitals/Pain Pain Assessment ?Pain  Assessment: No/denies pain  ? ? ?Home Living   ?  ?  ?  ?  ?  ?  ?  ?  ?  ?   ?  ?Prior Function    ?  ?  ?   ? ?PT Goals (current goals can now be found in the care plan section) Acute Rehab PT Goals ?Patient Stated Goal: return to normal activities ?Time For Goal Achievement: 07/12/21 ?Potential to Achieve Goals: Good ?Progress towards PT goals: Progressing toward goals ? ?  ?Frequency ? ? ? Min 3X/week ? ? ? ?  ?PT Plan Current plan remains appropriate;Frequency needs to be updated  ? ? ?Co-evaluation   ?  ?  ?  ?  ? ?  ?AM-PAC PT "6 Clicks" Mobility   ?Outcome Measure ? Help needed turning from your back to your side while in a flat bed without using bedrails?: None ?Help needed moving from lying on your back to sitting on the side of a flat bed without using bedrails?: None ?Help needed moving to and from a bed to a chair (including a wheelchair)?: A Little ?Help needed standing up from a chair using your arms (e.g., wheelchair or bedside chair)?: A Little ?Help needed to walk in hospital room?: A Little ?Help needed climbing 3-5 steps with a railing? : A Little ?6 Click Score: 20 ? ?  ?End of Session   ?Activity Tolerance: Patient tolerated treatment well ?Patient left: in bed;with call bell/phone within reach;with family/visitor present ?Nurse Communication: Mobility status;Other (comment) (OK to walk in halllway with walker and family or nursing) ?PT Visit Diagnosis: Other abnormalities of gait and mobility (R26.89);History of falling (Z91.81);Muscle weakness (generalized) (M62.81) ?  ? ? ?Time: 9021-1155 ?PT Time Calculation (min) (ACUTE ONLY): 22 min ? ?Charges:  $Gait Training: 8-22 mins          ?          ? ? ?Jerolyn Center, PT ?Acute Rehabilitation Services  ?Pager [phone] ?Office [phone] ? ? ? ?Scherrie November Devyn Griffing ?06/30/2021, 2:57 PM ? ?

## 2021-06-30 NOTE — Progress Notes (Signed)
OB Progress Note ? ?S: Reporting some Braxton hicks ctx this AM (has been having since 20 weeks). Otherwise stable, overall patient is hopeful because neuro symptoms improving ? ? ?O: ?Today's Vitals  ? 06/29/21 1940 06/29/21 1948 06/29/21 2327 06/30/21 0745  ?BP:  (!) 129/57 (!) 129/55 125/62  ?Pulse:  (!) 56 78 71  ?Resp:  16 18 15   ?Temp:  98.5 ?F (36.9 ?C) 98.2 ?F (36.8 ?C) 97.9 ?F (36.6 ?C)  ?TempSrc:  Oral Oral Oral  ?SpO2:  100% 100% 100%  ?Weight:      ?Height:      ?PainSc: 0-No pain   0-No pain  ? ?Body mass index is 30.23 kg/m?. ? ?Gen: NAD, resting in bed ?Abd: soft, gravid ? ?FHR:  160bpm ?Toco: irritability ? ? ?A/P: @ [redacted]w[redacted]d, admitted for Neuro management of optic neuritis and concern for transverse myelitis ?- Stable from OB standpoint, some [redacted]w[redacted]d this AM ? ?M. Deberah Pelton, MD 06/30/21 9:58 AM  ? ?

## 2021-06-30 NOTE — Progress Notes (Addendum)
NEUROLOGY CONSULTATION PROGRESS NOTE  ? ?Date of service: June 30, 2021 ?Patient Name: [name] ?MRN:  [id number] ?DOB:  1980/07/29 ? ?Brief HPI  ? ?41 yo F with optic neuritis on the right as well as symptoms highly concerning for transverse myelitis.  These two findings occurring concurrently would be very worrisome for neuromyelitis optica. MRI brain with 1.5 cm focus of abnormal T2/FLAIR signal abnormality involving the right middle cerebellar peduncle extending into the right lateral pons. Finding is indeterminate, but favored to reflect a focal area of demyelination. Normal MRI appearance of the cervical and thoracic spinal cord. No evidence for demyelinating disease. MRI with contrast confirms R optic neuritis and also notable for subtle enhancement of the R brachium pontis. ?  ?Interval Hx  ? ?No family at bedside. ? ?Improved color vision and acuity in right eye.  ?Steadier on her feet, still requires walker to ambulate.  Lower extremity strength is improved, no tingling or numbness. ? ?Vitals  ? ?Vitals:  ? 06/29/21 1644 06/29/21 1948 06/29/21 2327 06/30/21 0745  ?BP: (!) 117/58 (!) 129/57 (!) 129/55 125/62  ?Pulse: 88 (!) 56 78 71  ?Resp: $Remov'16 16 18 15  'bzyapE$ ?Temp: 98.2 ?F (36.8 ?C) 98.5 ?F (36.9 ?C) 98.2 ?F (36.8 ?C) 97.9 ?F (36.6 ?C)  ?TempSrc: Oral Oral Oral Oral  ?SpO2: 100% 100% 100% 100%  ?Weight:      ?Height:      ?  ? ?Body mass index is 30.23 kg/m?. ? ?Physical Exam  ? ?General: Laying comfortably in bed; in no acute distress.  ?HENT: Normal oropharynx and mucosa. Normal external appearance of ears and nose.  ?Neck: Supple, no pain or tenderness  ?CV: No peripheral edema.  ?Pulmonary: Symmetric Chest rise. Normal respiratory effort.  ?Abdomen: Soft to touch, non-tender.  ?Ext: No cyanosis, edema, or deformity  ?Skin: No rash. Normal palpation of skin.   ?Musculoskeletal: Normal digits and nails by inspection. No clubbing.  ? ?Neurologic Examination  ?Mental status/Cognition: Alert, oriented to  self, place, month and year, good attention.  ?Speech/language: Fluent, comprehension intact, object naming intact, repetition intact.  ?Cranial nerves:  ? CN II Pupils equal and reactive to light, visual acuity in R eye is improved but still significantly decreased with central vision being worse than peripheral.  ?No afferent pupillar defect. ?Decreased color vision - "gray curtain"  ? CN III,IV,VI EOM intact, no gaze preference or deviation, no nystagmus  ? CN V normal sensation in V1, V2, and V3 segments bilaterally   ? CN VII no asymmetry, no nasolabial fold flattening   ? CN VIII normal hearing to speech   ? CN IX & X normal palatal elevation, no uvular deviation   ? CN XI 5/5 head turn and 5/5 shoulder shrug bilaterally   ? CN XII midline tongue protrusion   ? ?Motor:  ?Muscle bulk: normal, tone normal ?Mvmt Root Nerve  Muscle Right Left Comments  ?SA C5/6 Ax Deltoid 5 5   ?EF C5/6 Mc Biceps 5 5   ?EE C6/7/8 Rad Triceps 5 5   ?WF C6/7 Med FCR     ?WE C7/8 PIN ECU     ?F Ab C8/T1 U ADM/FDI 5 5   ?HF L1/2/3 Fem Illopsoas 5 5   ?KE L2/3/4 Fem Quad 5 5   ?DF L4/5 D Peron Tib Ant 5 5   ?PF S1/2 Tibial Grc/Sol 5 5   ? ?Reflexes: ? Right Left Comments  ?Pectoralis     ? Biceps (  C5/6) 2 2   ?Brachioradialis (C5/6) 2 2   ? Triceps (C6/7) 2 2   ? Patellar (L3/4) 1 2   ? Achilles (S1) 1 2   ? Hoffman     ? Plantar down down   ?Jaw jerk   ? ?Sensation: ? Light touch T8/T9 - Duller sensations that wraps around, right above umbilical.   ? Pin prick   ? Temperature   ? Vibration   ?Proprioception   ? ?Coordination/Complex Motor:  ?- Finger to Nose intact BL ?- Heel to shin intact BL ?- Rapid alternating movement are normal ?- Gait: Deferred for patient safety. ? ?Labs  ? ?Basic Metabolic Panel:  ?Lab Results  ?Component Value Date  ? NA 136 06/26/2021  ? K 3.7 06/26/2021  ? CO2 23 06/26/2021  ? GLUCOSE 95 06/26/2021  ? BUN 14 06/26/2021  ? CREATININE 0.49 06/26/2021  ? CALCIUM 9.0 06/26/2021  ? GFRNONAA >60 06/26/2021  ?  GFRAA  05/15/2009  ?  >60        ?The eGFR has been calculated ?using the MDRD equation. ?This calculation has not been ?validated in all clinical ?situations. ?eGFR's persistently ?<60 mL/min signify ?possible Chronic Kidney Disease.  ? ?HbA1c: No results found for: HGBA1C ?LDL:  ?Lab Results  ?Component Value Date  ? [place] 81 12/31/2011  ? ?Urine Drug Screen: No results found for: LABOPIA, COCAINSCRNUR, [place], [place], THCU, LABBARB  ?Alcohol Level No results found for: ETH ?No results found for: PHENYTOIN, ZONISAMIDE, LAMOTRIGINE, LEVETIRACETA ?No results found for: PHENYTOIN, PHENOBARB, VALPROATE, CBMZ ? ?Imaging and Diagnostic studies  ? ?MRI HEAD IMPRESSION: ? ?1. 1.5 cm focus of abnormal T2/FLAIR signal abnormality involving ?the right middle cerebellar peduncle extending into the right ?lateral pons. Finding is indeterminate, but favored to reflect a ?focal area of demyelination. Possible changes of subacute ischemia ?or mass/glioma would be the primary differential considerations, ?although felt to be less likely. No significant mass effect. ?2. Single additional punctate 3 mm focus of FLAIR hyperintensity ?involving the subcortical right frontal lobe, indeterminate. ?3. Otherwise normal brain MRI. ? ?MRI CERVICAL AND THORACIC SPINE IMPRESSION: ? ?1. Normal MRI appearance of the cervical and thoracic spinal cord. ?No evidence for demyelinating disease. ?2. Mild degenerative spondylosis at C4-5 and C5-6 without ?significant stenosis or neural impingement. ? ?MRI Orbits with and without contrast: ?1. Abnormal T2 hyperintensity and enhancement of the right optic ?nerve extending beyond the orbital apex up to the chiasm. This is ?consistent with optic neuritis. ?2. Focal area of T2 hyperintensity in the right cerebellar peduncle ?with subtle enhancement. ?3. 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 ? ?MRI brain with contrast: ?1. The subtle focus of enhancement in the right brachium  pontis ?demonstrated on the orbit MRI is not appreciated on this study. ?2. No other focal enhancement of the brain ?3. Right optic nerve enhancement. Findings consistent with optic ?neuritis. Please see full discussion in the report of the MRI ?orbits. ? ?MRI C and T spine with contrast: ?1. Normal contrast-enhanced MRI of the cervical and thoracic spine. ?No evidence of transverse myelitis or demyelinating disease. ? ? ? ?Impression  ? ?41 yo F with optic neuritis on the right as well as symptoms concerning for transverse myelitis on clinical exam with allodynia, BL lower extremity weakness and numbness with a sensory level initially at T8. 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 ? ?Improvement in sxs on day 4 of IV steroids, likely no need for PLEX at this time.  ? ?Differential includes: Neuromyelitis optica, MS, Neurosarcoidosis. Less likely tuberculosis, systemic autoimmune disease with secondary neurological involvement. ? ?Recommendations  ?- NMO Panel ([facility]), MOG Ab ([facility]), ANA, ACE, IL2R, RPR pending. ?- Continue Solumedrol 1G Daily x 5 doses, no PLEX at this time. If day 5 (last day) still significant deficits, will consider PLEX.   ?- Gabapentin $RemoveBefor'100mg'HbdZDSPVonjb$  TID PRN for neuropathic pain ?- Will hold off on LP at this time. Given improvement, unlikely that LP will change the treatment course at this time. ?- Follow-up outpatient with Dr. Felecia Shelling at [facility]  ?___________________________________________________________________ ? ?Thank you for the opportunity to take part in the care of this patient. If you have any further questions, please contact the neurology consultation attending. ? ?Signed: ?Merrily Brittle, DO ?Resident, PGY-1 ?[facility] ?06/30/2021, 11:47 AM  ? ?Attending Neurohospitalist Addendum ?Patient seen and  examined with APP/Resident. ?Agree with the history and physical as documented above. ?Agree with the plan as documented, which I helped formulate. ?I have independently reviewed the chart, obtained history,

## 2021-07-01 DIAGNOSIS — H469 Unspecified optic neuritis: Secondary | ICD-10-CM | POA: Diagnosis not present

## 2021-07-01 MED ORDER — GABAPENTIN 100 MG PO CAPS: 100.0000 mg | ORAL_CAPSULE | Freq: Three times a day (TID) | ORAL | 4 refills | Status: DC | PRN

## 2021-07-01 MED ORDER — PHENYLEPHRINE HCL-NACL 20-0.9 MG/250ML-% IV SOLN
INTRAVENOUS | Status: AC
  Filled 2021-07-01: qty 250

## 2021-07-01 MED ORDER — CYCLOBENZAPRINE HCL 10 MG PO TABS: 10.0000 mg | ORAL_TABLET | Freq: Four times a day (QID) | ORAL | 0 refills | Status: DC | PRN

## 2021-07-01 MED ORDER — PROPOFOL 1000 MG/100ML IV EMUL
INTRAVENOUS | Status: AC
  Filled 2021-07-01: qty 100

## 2021-07-01 NOTE — Progress Notes (Signed)
41 y.o. [id number] [redacted]w[redacted]d HD#4 admitted for Optic neuritis [H46.9]. ? ?Pt currently stable with no c/o for OB.  Good FM. ? ?Vitals:  ? 06/30/21 1538 06/30/21 1953 06/30/21 2250 07/01/21 0535  ?BP: 130/72 123/69 130/64 133/75  ?Pulse: 73 60 82 76  ?Resp: 16 16 18 18   ?Temp: 98.1 ?F (36.7 ?C) 98.1 ?F (36.7 ?C) 98 ?F (36.7 ?C) 98.1 ?F (36.7 ?C)  ?TempSrc: Oral Oral Oral Oral  ?SpO2: 99% 99% 99% 100%  ?Weight:      ?Height:      ? ? ?Lungs CTA ?Cor RRR ?Abd  Soft, gravid, nontender ?Ex SCDs ?FHTs 130s, gSTV, NST  R yesterday. ?Toco  none ? ?No results found for this or any previous visit (from the past 24 hour(s)). ? ?A:  HD#4  [redacted]w[redacted]d with optic neuritis, being mananged by neuro.  Pt stable from OB standpoint. ? ?P: ?Continue daily FHTs.   ? ?[redacted]w[redacted]d ?  ?

## 2021-07-01 NOTE — Discharge Summary (Signed)
Physician Discharge Summary  ?Patient ID: ?[name] ?MRN: [id number] ?DOB/AGE: May 31, 1980 41 y.o. ? ?Admit date: 06/26/2021 ?Discharge date: 07/21/2021 ? ?Admission Diagnoses:optic neuritis ? ?Discharge Diagnoses:  ?Principal Problem: ?  Optic neuritis ?Possible transverse myelitis ? ?Discharged Condition: good ? ?Hospital Course: Per Neurology: ?NEUROLOGY CONSULTATION PROGRESS NOTE  ?  ?Date of service: July 01, 2021 ?Patient Name: [name] ?MRN:  [id number] ?DOB:  Dec 27, 1980 ?  ?Brief HPI  ?  ?41 yo F with optic neuritis on the right as well as symptoms highly concerning for transverse myelitis.  These two findings occurring concurrently would be very worrisome for neuromyelitis optica. MRI brain with 1.5 cm focus of abnormal T2/FLAIR signal abnormality involving the right middle cerebellar peduncle extending into the right lateral pons. Finding is indeterminate, but favored to reflect a focal area of demyelination. Normal MRI appearance of the cervical and thoracic spinal cord. No evidence for demyelinating disease. MRI with contrast confirms R optic neuritis and also notable for subtle enhancement of the R brachium pontis. ?  ?Interval Hx  ?  ?No family at bedside. ?  ?"Everything felt brighter this morning!".  ?Color vision has almost resolved, reported residual "white veil". Acuity has also improved, nearly symmetric with OS.  ?Denied tingling or numbness in BLE. Still requires walker to ambulate, but feels steadier.  ?Denied side effects from IV steroids, besides pencil shaving taste in the back of her mouth in the AM.  ?  ?Vitals  ?  ?      ?Vitals:  ?  06/30/21 1538 06/30/21 1953 06/30/21 2250 07/01/21 0535  ?BP: 130/72 123/69 130/64 133/75  ?Pulse: 73 60 82 76  ?Resp: $Remov'16 16 18 18  'GkiuGQ$ ?Temp: 98.1 ?F (36.7 ?C) 98.1 ?F (36.7 ?C) 98 ?F (36.7 ?C) 98.1 ?F (36.7 ?C)  ?TempSrc: Oral Oral Oral Oral  ?SpO2: 99% 99% 99% 100%  ?Weight:          ?Height:          ?  ?  ?Body mass index is 30.23 kg/m?. ?  ?Physical  Exam  ?  ?General: Laying comfortably in bed; in no acute distress.  ?HENT: Normal oropharynx and mucosa. Normal external appearance of ears and nose.  ?Neck: Supple, no pain or tenderness  ?CV: No peripheral edema.  ?Pulmonary: Symmetric Chest rise. Normal respiratory effort.  ?Abdomen: Soft to touch, non-tender.  ?Ext: No cyanosis, edema, or deformity  ?Skin: No rash. Normal palpation of skin.   ?Musculoskeletal: Normal digits and nails by inspection. No clubbing.  ?  ?Neurologic Examination  ?Mental status/Cognition: Alert, oriented to self, place, month and year, good attention.  ?Speech/language: Fluent, comprehension intact, object naming intact, repetition intact.  ?Cranial nerves:  ? CN II Pupils equal and reactive to light, visual acuity in R eye is improved but still significantly decreased with central vision being worse than peripheral.  ?No afferent pupillar defect. ?Decreased color vision - "white curtain"  ? CN III,IV,VI EOM intact, no gaze preference or deviation, no nystagmus  ? CN V normal sensation in V1, V2, and V3 segments bilaterally   ? CN VII no asymmetry, no nasolabial fold flattening   ? CN VIII normal hearing to speech   ? CN IX & X normal palatal elevation, no uvular deviation   ? CN XI 5/5 head turn and 5/5 shoulder shrug bilaterally   ? CN XII midline tongue protrusion   ?  ?Motor:  ?Muscle bulk: normal, tone normal ?Mvmt Root Nerve  Muscle Right Left Comments  ?SA C5/6 Ax Deltoid 5 5    ?EF C5/6 Mc Biceps 5 5    ?EE C6/7/8 Rad Triceps 5 5    ?WF C6/7 Med FCR        ?WE C7/8 PIN ECU        ?F Ab C8/T1 U ADM/FDI 5 5    ?HF L1/2/3 Fem Illopsoas 5 5    ?KE L2/3/4 Fem Quad 5 5    ?DF L4/5 D Peron Tib Ant 5 5    ?PF S1/2 Tibial Grc/Sol 5 5    ?  ?Reflexes: ?  Right Left Comments  ?Pectoralis        ? Biceps (C5/6) 2 2    ?Brachioradialis (C5/6) 2 2    ? Triceps (C6/7) 2 2    ? Patellar (L3/4) 1 2    ? Achilles (S1) 1 2    ? Hoffman        ? Plantar down down    ?Jaw jerk     ?  ?Sensation: ?  Light touch T8/T9 - Duller sensations that wraps around, right above umbilical.   ? Pin prick    ? Temperature    ? Vibration    ?Proprioception    ?  ?Coordination/Complex Motor:  ?- Finger to Nose intact BL ?- Heel to shin intact BL ?- Rapid alternating movement are normal ?- Gait: Deferred for patient safety. ?  ?Labs  ?  ?Basic Metabolic Panel:  ?Recent Labs  ?      ?Lab Results  ?Component Value Date  ?  NA 136 06/26/2021  ?  K 3.7 06/26/2021  ?  CO2 23 06/26/2021  ?  GLUCOSE 95 06/26/2021  ?  BUN 14 06/26/2021  ?  CREATININE 0.49 06/26/2021  ?  CALCIUM 9.0 06/26/2021  ?  GFRNONAA >60 06/26/2021  ?  GFRAA   05/15/2009  ?    >60        ?The eGFR has been calculated ?using the MDRD equation. ?This calculation has not been ?validated in all clinical ?situations. ?eGFR's persistently ?<60 mL/min signify ?possible Chronic Kidney Disease.  ?  ?  ?HbA1c:  ?Recent Labs  ?No results found for: HGBA1C  ? ?LDL:  ?Recent Labs  ?     ?Lab Results  ?Component Value Date  ?  [place] 81 12/31/2011  ?  ?  ?Urine Drug Screen:  ?Labs (Brief)  ?No results found for: LABOPIA, COCAINSCRNUR, [place], [place], [place], [place]  ?  ?Alcohol Level  ?Labs (Brief)  ?No results found for: [facility]  ? ?Recent Labs  ?No results found for: PHENYTOIN, ZONISAMIDE, LAMOTRIGINE, LEVETIRACETA  ? ?Recent Labs  ?No results found for: PHENYTOIN, PHENOBARB, VALPROATE, CBMZ  ? ?  ?Imaging and Diagnostic studies  ?  ?MRI HEAD IMPRESSION: ?  ?1. 1.5 cm focus of abnormal T2/FLAIR signal abnormality involving ?the right middle cerebellar peduncle extending into the right ?lateral pons. Finding is indeterminate, but favored to reflect a ?focal area of demyelination. Possible changes of subacute ischemia ?or mass/glioma would be the primary differential considerations, ?although felt to be less likely. No significant mass effect. ?2. Single additional punctate 3 mm focus of FLAIR hyperintensity ?involving the subcortical right frontal lobe, indeterminate. ?3.  Otherwise normal brain MRI. ?  ?MRI CERVICAL AND THORACIC SPINE IMPRESSION: ?  ?1. Normal MRI appearance of the cervical and thoracic spinal cord. ?No evidence for demyelinating disease. ?2. Mild degenerative spondylosis at C4-5 and C5-6 without ?  significant stenosis or neural impingement. ?  ?MRI Orbits with and without contrast: ?1. Abnormal T2 hyperintensity and enhancement of the right optic ?nerve extending beyond the orbital apex up to the chiasm. This is ?consistent with optic neuritis. ?2. Focal area of T2 hyperintensity in the right cerebellar peduncle ?with subtle enhancement. ?3. 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 ?  ?MRI brain with contrast: ?1. The subtle focus of enhancement in the right brachium pontis ?demonstrated on the orbit MRI is not appreciated on this study. ?2. No other focal enhancement of the brain ?3. Right optic nerve enhancement. Findings consistent with optic ?neuritis. Please see full discussion in the report of the MRI ?orbits. ?  ?MRI C and T spine with contrast: ?1. Normal contrast-enhanced MRI of the cervical and thoracic spine. ?No evidence of transverse myelitis or demyelinating disease. ?  ?  ?  ?Impression  ?  ?41 yo F with optic neuritis on the right as well as symptoms concerning for transverse myelitis on clinical exam with allodynia, BL lower extremity weakness and numbness with a sensory level initially at T8. 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 ?  ?Improvement in sxs on day 5 of IV steroids, no need for PLEX at this time. Still has residual decreased color vision and acuity in OD as well as RLE 1+ reflex in knee and achilles.  ?  ?Suspect her T8/T9 decreased sensory line that is b/l and wraps anteriorly is 2/2 her pregnancy changes than MS.  ?  ?Differential includes: Neuromyelitis  optica, MS, Neurosarcoidosis. Less likely tuberculosis, systemic autoimmune disease with secondary neurological involvement. ?  ?Recommendations  ?- NMO Panel ([facility]), MOG Ab ([facility]), ANA, ACE, IL2R,

## 2021-07-01 NOTE — Progress Notes (Addendum)
NEUROLOGY CONSULTATION PROGRESS NOTE  ? ?Date of service: July 01, 2021 ?Patient Name: [name] ?MRN:  [id number] ?DOB:  October 24, 1980 ? ?Brief HPI  ? ?41 yo F with optic neuritis on the right as well as symptoms highly concerning for transverse myelitis.  These two findings occurring concurrently would be very worrisome for neuromyelitis optica. MRI brain with 1.5 cm focus of abnormal T2/FLAIR signal abnormality involving the right middle cerebellar peduncle extending into the right lateral pons. Finding is indeterminate, but favored to reflect a focal area of demyelination. Normal MRI appearance of the cervical and thoracic spinal cord. No evidence for demyelinating disease. MRI with contrast confirms R optic neuritis and also notable for subtle enhancement of the R brachium pontis. ?  ?Interval Hx  ? ?No family at bedside. ? ?"Everything felt brighter this morning!".  ?Color vision has almost resolved, reported residual "white veil". Acuity has also improved, nearly symmetric with OS.  ?Denied tingling or numbness in BLE. Still requires walker to ambulate, but feels steadier.  ?Denied side effects from IV steroids, besides pencil shaving taste in the back of her mouth in the AM.  ? ?Vitals  ? ?Vitals:  ? 06/30/21 1538 06/30/21 1953 06/30/21 2250 07/01/21 0535  ?BP: 130/72 123/69 130/64 133/75  ?Pulse: 73 60 82 76  ?Resp: _0 ?Temp: 98.1 ?F (36.7 ?C) 98.1 ?F (36.7 ?C) 98 ?F (36.7 ?C) 98.1 ?F (36.7 ?C)  ?TempSrc: Oral Oral Oral Oral  ?SpO2: 99% 99% 99% 100%  ?Weight:      ?Height:      ?  ? ?Body mass index is 30.23 kg/m?. ? ?Physical Exam  ? ?General: Laying comfortably in bed; in no acute distress.  ?HENT: Normal oropharynx and mucosa. Normal external appearance of ears and nose.  ?Neck: Supple, no pain or tenderness  ?CV: No peripheral edema.  ?Pulmonary: Symmetric Chest rise. Normal respiratory effort.  ?Abdomen: Soft to touch, non-tender.  ?Ext: No cyanosis, edema, or deformity  ?Skin: No rash.  Normal palpation of skin.   ?Musculoskeletal: Normal digits and nails by inspection. No clubbing.  ? ?Neurologic Examination  ?Mental status/Cognition: Alert, oriented to self, place, month and year, good attention.  ?Speech/language: Fluent, comprehension intact, object naming intact, repetition intact.  ?Cranial nerves:  ? CN II Pupils equal and reactive to light, visual acuity in R eye is improved but still significantly decreased with central vision being worse than peripheral.  ?No afferent pupillar defect. ?Decreased color vision - "white curtain"  ? CN III,IV,VI EOM intact, no gaze preference or deviation, no nystagmus  ? CN V normal sensation in V1, V2, and V3 segments bilaterally   ? CN VII no asymmetry, no nasolabial fold flattening   ? CN VIII normal hearing to speech   ? CN IX & X normal palatal elevation, no uvular deviation   ? CN XI 5/5 head turn and 5/5 shoulder shrug bilaterally   ? CN XII midline tongue protrusion   ? ?Motor:  ?Muscle bulk: normal, tone normal ?Mvmt Root Nerve  Muscle Right Left Comments  ?SA C5/6 Ax Deltoid 5 5   ?EF C5/6 Mc Biceps 5 5   ?EE C6/7/8 Rad Triceps 5 5   ?WF C6/7 Med FCR     ?WE C7/8 PIN ECU     ?F Ab C8/T1 U ADM/FDI 5 5   ?HF L1/2/3 Fem Illopsoas 5 5   ?KE L2/3/4 Fem Quad 5 5   ?DF L4/5 D Peron Tib  Ant 5 5   ?PF S1/2 Tibial Grc/Sol 5 5   ? ?Reflexes: ? Right Left Comments  ?Pectoralis     ? Biceps (C5/6) 2 2   ?Brachioradialis (C5/6) 2 2   ? Triceps (C6/7) 2 2   ? Patellar (L3/4) 1 2   ? Achilles (S1) 1 2   ? Hoffman     ? Plantar down down   ?Jaw jerk   ? ?Sensation: ? Light touch T8/T9 - Duller sensations that wraps around, right above umbilical.   ? Pin prick   ? Temperature   ? Vibration   ?Proprioception   ? ?Coordination/Complex Motor:  ?- Finger to Nose intact BL ?- Heel to shin intact BL ?- Rapid alternating movement are normal ?- Gait: Deferred for patient safety. ? ?Labs  ? ?Basic Metabolic Panel:  ?Lab Results  ?Component Value Date  ? NA 136 06/26/2021  ?  K 3.7 06/26/2021  ? CO2 23 06/26/2021  ? GLUCOSE 95 06/26/2021  ? BUN 14 06/26/2021  ? CREATININE 0.49 06/26/2021  ? CALCIUM 9.0 06/26/2021  ? GFRNONAA >60 06/26/2021  ? GFRAA  05/15/2009  ?  >60        ?The eGFR has been calculated ?using the MDRD equation. ?This calculation has not been ?validated in all clinical ?situations. ?eGFR's persistently ?<60 mL/min signify ?possible Chronic Kidney Disease.  ? ?HbA1c: No results found for: HGBA1C ?LDL:  ?Lab Results  ?Component Value Date  ? [place] 81 12/31/2011  ? ?Urine Drug Screen: No results found for: LABOPIA, COCAINSCRNUR, [place], [place], THCU, LABBARB  ?Alcohol Level No results found for: ETH ?No results found for: PHENYTOIN, ZONISAMIDE, LAMOTRIGINE, LEVETIRACETA ?No results found for: PHENYTOIN, PHENOBARB, VALPROATE, CBMZ ? ?Imaging and Diagnostic studies  ? ?MRI HEAD IMPRESSION: ? ?1. 1.5 cm focus of abnormal T2/FLAIR signal abnormality involving ?the right middle cerebellar peduncle extending into the right ?lateral pons. Finding is indeterminate, but favored to reflect a ?focal area of demyelination. Possible changes of subacute ischemia ?or mass/glioma would be the primary differential considerations, ?although felt to be less likely. No significant mass effect. ?2. Single additional punctate 3 mm focus of FLAIR hyperintensity ?involving the subcortical right frontal lobe, indeterminate. ?3. Otherwise normal brain MRI. ? ?MRI CERVICAL AND THORACIC SPINE IMPRESSION: ? ?1. Normal MRI appearance of the cervical and thoracic spinal cord. ?No evidence for demyelinating disease. ?2. Mild degenerative spondylosis at C4-5 and C5-6 without ?significant stenosis or neural impingement. ? ?MRI Orbits with and without contrast: ?1. Abnormal T2 hyperintensity and enhancement of the right optic ?nerve extending beyond the orbital apex up to the chiasm. This is ?consistent with optic neuritis. ?2. Focal area of T2 hyperintensity in the right cerebellar peduncle ?with  subtle enhancement. ?3. 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 ? ?MRI brain with contrast: ?1. The subtle focus of enhancement in the right brachium pontis ?demonstrated on the orbit MRI is not appreciated on this study. ?2. No other focal enhancement of the brain ?3. Right optic nerve enhancement. Findings consistent with optic ?neuritis. Please see full discussion in the report of the MRI ?orbits. ? ?MRI C and T spine with contrast: ?1. Normal contrast-enhanced MRI of the cervical and thoracic spine. ?No evidence of transverse myelitis or demyelinating disease. ? ? ? ?Impression  ? ?41 yo F with optic neuritis on the right as well as symptoms concerning for transverse myelitis on clinical exam with allodynia, BL lower extremity weakness and numbness with a sensory level initially  at T8. 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 ? ?Improvement in sxs on day 5 of IV steroids, no need for PLEX at this time. Still has residual decreased color vision and acuity in OD as well as RLE 1+ reflex in knee and achilles.  ? ?Suspect her T8/T9 decreased sensory line that is b/l and wraps anteriorly is 2/2 her pregnancy changes than MS.  ? ?Differential includes: Neuromyelitis optica, MS, Neurosarcoidosis. Less likely tuberculosis, systemic autoimmune disease with secondary neurological involvement. ? ?Recommendations  ?- NMO Panel ([facility]), MOG Ab ([facility]), ANA, ACE, IL2R, RPR pending. ?- Solumedrol 1G Daily x 5 doses - COMPLETED (07/01/2021) ?- No PLEX at this time ?- Gabapentin 118m TID PRN for neuropathic pain ?- Will hold off on LP at this time. Given improvement, unlikely that LP will change the treatment course at this time. ?- [facility] Referral  ordered ?___________________________________________________________________ ? ?Thank you for the opportunity to take part in the care of this patient. If you have any further questions, please contact the neurology consultation attending.

## 2021-07-01 NOTE — Plan of Care (Signed)
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
## 2021-07-08 ENCOUNTER — Encounter: Payer: Self-pay | Admitting: Neurology

## 2021-07-08 ENCOUNTER — Ambulatory Visit: Payer: 59 | Admitting: Neurology

## 2021-07-08 VITALS — BP 128/75 | HR 82 | Ht 67.0 in | Wt 205.5 lb

## 2021-07-08 DIAGNOSIS — G378 Other specified demyelinating diseases of central nervous system: Secondary | ICD-10-CM | POA: Diagnosis not present

## 2021-07-08 DIAGNOSIS — R260 Ataxic gait: Secondary | ICD-10-CM

## 2021-07-08 DIAGNOSIS — H469 Unspecified optic neuritis: Secondary | ICD-10-CM

## 2021-07-08 DIAGNOSIS — G379 Demyelinating disease of central nervous system, unspecified: Secondary | ICD-10-CM

## 2021-07-08 DIAGNOSIS — R200 Anesthesia of skin: Secondary | ICD-10-CM

## 2021-07-13 ENCOUNTER — Encounter: Payer: Self-pay | Admitting: Physical Therapy

## 2021-07-13 ENCOUNTER — Ambulatory Visit: Payer: 59 | Attending: Neurology | Admitting: Physical Therapy

## 2021-07-13 DIAGNOSIS — R2681 Unsteadiness on feet: Secondary | ICD-10-CM | POA: Insufficient documentation

## 2021-07-13 DIAGNOSIS — G378 Other specified demyelinating diseases of central nervous system: Secondary | ICD-10-CM | POA: Diagnosis not present

## 2021-07-13 DIAGNOSIS — R278 Other lack of coordination: Secondary | ICD-10-CM | POA: Insufficient documentation

## 2021-07-13 DIAGNOSIS — G379 Demyelinating disease of central nervous system, unspecified: Secondary | ICD-10-CM | POA: Diagnosis not present

## 2021-07-13 DIAGNOSIS — R260 Ataxic gait: Secondary | ICD-10-CM | POA: Diagnosis present

## 2021-07-13 DIAGNOSIS — R2689 Other abnormalities of gait and mobility: Secondary | ICD-10-CM | POA: Diagnosis present

## 2021-07-13 DIAGNOSIS — M6281 Muscle weakness (generalized): Secondary | ICD-10-CM | POA: Insufficient documentation
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
## 2021-07-14 ENCOUNTER — Encounter: Payer: Self-pay | Admitting: Neurology

## 2021-07-20 ENCOUNTER — Ambulatory Visit: Payer: 59 | Admitting: Physical Therapy

## 2021-07-20 DIAGNOSIS — R2681 Unsteadiness on feet: Secondary | ICD-10-CM

## 2021-07-20 DIAGNOSIS — R260 Ataxic gait: Secondary | ICD-10-CM | POA: Diagnosis not present

## 2021-07-20 DIAGNOSIS — M6281 Muscle weakness (generalized): Secondary | ICD-10-CM

## 2021-07-20 DIAGNOSIS — R2689 Other abnormalities of gait and mobility: Secondary | ICD-10-CM
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
## 2021-07-23 ENCOUNTER — Encounter: Payer: Self-pay | Admitting: Physical Therapy

## 2021-07-23 ENCOUNTER — Ambulatory Visit: Payer: 59 | Admitting: Physical Therapy

## 2021-07-23 DIAGNOSIS — R2689 Other abnormalities of gait and mobility: Secondary | ICD-10-CM

## 2021-07-23 DIAGNOSIS — M6281 Muscle weakness (generalized): Secondary | ICD-10-CM

## 2021-07-23 DIAGNOSIS — R2681 Unsteadiness on feet: Secondary | ICD-10-CM

## 2021-07-23 DIAGNOSIS — R260 Ataxic gait: Secondary | ICD-10-CM | POA: Diagnosis not present
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
## 2021-07-27 ENCOUNTER — Ambulatory Visit: Payer: 59 | Admitting: Physical Therapy

## 2021-07-28 ENCOUNTER — Encounter: Payer: Self-pay | Admitting: Physical Therapy

## 2021-07-28 ENCOUNTER — Ambulatory Visit: Payer: 59 | Admitting: Physical Therapy

## 2021-07-28 DIAGNOSIS — M6281 Muscle weakness (generalized): Secondary | ICD-10-CM

## 2021-07-28 DIAGNOSIS — R260 Ataxic gait: Secondary | ICD-10-CM

## 2021-07-28 DIAGNOSIS — R2681 Unsteadiness on feet: Secondary | ICD-10-CM

## 2021-07-28 DIAGNOSIS — R2689 Other abnormalities of gait and mobility: Secondary | ICD-10-CM
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
## 2021-07-29 ENCOUNTER — Ambulatory Visit: Payer: 59 | Admitting: Physical Therapy

## 2021-07-29 ENCOUNTER — Encounter: Payer: Self-pay | Admitting: Physical Therapy

## 2021-07-29 DIAGNOSIS — R260 Ataxic gait: Secondary | ICD-10-CM | POA: Diagnosis not present

## 2021-07-29 DIAGNOSIS — M6281 Muscle weakness (generalized): Secondary | ICD-10-CM

## 2021-07-29 DIAGNOSIS — R278 Other lack of coordination: Secondary | ICD-10-CM

## 2021-07-29 DIAGNOSIS — R2681 Unsteadiness on feet: Secondary | ICD-10-CM

## 2021-07-29 DIAGNOSIS — R2689 Other abnormalities of gait and mobility: Secondary | ICD-10-CM
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
## 2021-08-03 ENCOUNTER — Ambulatory Visit: Payer: 59 | Admitting: Physical Therapy

## 2021-08-04 ENCOUNTER — Encounter: Payer: Self-pay | Admitting: Physical Therapy

## 2021-08-04 ENCOUNTER — Ambulatory Visit: Payer: 59 | Admitting: Physical Therapy

## 2021-08-04 DIAGNOSIS — R260 Ataxic gait: Secondary | ICD-10-CM | POA: Diagnosis not present

## 2021-08-04 DIAGNOSIS — R2689 Other abnormalities of gait and mobility: Secondary | ICD-10-CM

## 2021-08-04 DIAGNOSIS — R2681 Unsteadiness on feet: Secondary | ICD-10-CM

## 2021-08-04 DIAGNOSIS — M6281 Muscle weakness (generalized): Secondary | ICD-10-CM
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
## 2021-08-05 ENCOUNTER — Ambulatory Visit: Payer: 59 | Admitting: Physical Therapy

## 2021-08-05 DIAGNOSIS — M6281 Muscle weakness (generalized): Secondary | ICD-10-CM

## 2021-08-05 DIAGNOSIS — R2681 Unsteadiness on feet: Secondary | ICD-10-CM

## 2021-08-05 DIAGNOSIS — R260 Ataxic gait: Secondary | ICD-10-CM | POA: Diagnosis not present
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
## 2021-08-11 ENCOUNTER — Encounter: Payer: Self-pay | Admitting: Physical Therapy

## 2021-08-11 ENCOUNTER — Ambulatory Visit: Payer: 59 | Admitting: Physical Therapy

## 2021-08-11 ENCOUNTER — Encounter: Payer: Self-pay | Admitting: Neurology

## 2021-08-11 DIAGNOSIS — R260 Ataxic gait: Secondary | ICD-10-CM | POA: Diagnosis not present

## 2021-08-11 DIAGNOSIS — M6281 Muscle weakness (generalized): Secondary | ICD-10-CM

## 2021-08-11 DIAGNOSIS — R2681 Unsteadiness on feet: Secondary | ICD-10-CM

## 2021-08-11 DIAGNOSIS — R2689 Other abnormalities of gait and mobility: Secondary | ICD-10-CM
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
## 2021-08-12 ENCOUNTER — Ambulatory Visit: Payer: 59 | Admitting: Physical Therapy

## 2021-08-18 ENCOUNTER — Ambulatory Visit: Payer: 59 | Attending: Neurology | Admitting: Physical Therapy

## 2021-08-18 DIAGNOSIS — R2689 Other abnormalities of gait and mobility: Secondary | ICD-10-CM | POA: Insufficient documentation

## 2021-08-18 DIAGNOSIS — R2681 Unsteadiness on feet: Secondary | ICD-10-CM | POA: Insufficient documentation

## 2021-08-18 DIAGNOSIS — M6281 Muscle weakness (generalized): Secondary | ICD-10-CM | POA: Insufficient documentation

## 2021-08-18 DIAGNOSIS — R260 Ataxic gait: Secondary | ICD-10-CM | POA: Insufficient documentation

## 2021-08-19 ENCOUNTER — Encounter: Payer: Self-pay | Admitting: Physical Therapy
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
## 2021-08-25 ENCOUNTER — Encounter (HOSPITAL_COMMUNITY): Payer: Self-pay | Admitting: Obstetrics and Gynecology

## 2021-08-25 ENCOUNTER — Other Ambulatory Visit: Payer: Self-pay

## 2021-08-25 ENCOUNTER — Encounter: Payer: Self-pay | Admitting: Physical Therapy

## 2021-08-25 ENCOUNTER — Ambulatory Visit: Payer: 59 | Admitting: Physical Therapy

## 2021-08-25 ENCOUNTER — Inpatient Hospital Stay (HOSPITAL_COMMUNITY)
Admission: AD | Admit: 2021-08-25 | Discharge: 2021-08-25 | Disposition: A | Discharge status: Home / Self Care | Payer: 59 | Attending: Obstetrics and Gynecology | Admitting: Obstetrics and Gynecology

## 2021-08-25 DIAGNOSIS — S30814A Abrasion of vagina and vulva, initial encounter: Secondary | ICD-10-CM | POA: Diagnosis not present

## 2021-08-25 DIAGNOSIS — Y939 Activity, unspecified: Secondary | ICD-10-CM | POA: Insufficient documentation

## 2021-08-25 DIAGNOSIS — Z3A31 31 weeks gestation of pregnancy: Secondary | ICD-10-CM | POA: Insufficient documentation

## 2021-08-25 DIAGNOSIS — M6281 Muscle weakness (generalized): Secondary | ICD-10-CM

## 2021-08-25 DIAGNOSIS — O26899 Other specified pregnancy related conditions, unspecified trimester: Secondary | ICD-10-CM

## 2021-08-25 DIAGNOSIS — O26893 Other specified pregnancy related conditions, third trimester: Secondary | ICD-10-CM | POA: Diagnosis present

## 2021-08-25 DIAGNOSIS — R2689 Other abnormalities of gait and mobility: Secondary | ICD-10-CM

## 2021-08-25 DIAGNOSIS — R109 Unspecified abdominal pain: Secondary | ICD-10-CM | POA: Insufficient documentation

## 2021-08-25 DIAGNOSIS — R2681 Unsteadiness on feet: Secondary | ICD-10-CM | POA: Diagnosis not present

## 2021-08-25 LAB — URINALYSIS, ROUTINE W REFLEX MICROSCOPIC
Bilirubin Urine: NEGATIVE
Glucose, UA: NEGATIVE mg/dL
Hgb urine dipstick: NEGATIVE
Ketones, ur: 5 mg/dL — AB
Leukocytes,Ua: NEGATIVE
Nitrite: NEGATIVE
Protein, ur: NEGATIVE mg/dL
Specific Gravity, Urine: 1.028 (ref 1.005–1.030)
pH: 6 (ref 5.0–8.0)

## 2021-08-25 LAB — WET PREP, GENITAL
Clue Cells Wet Prep HPF POC: NONE SEEN
Sperm: NONE SEEN
Trich, Wet Prep: NONE SEEN
WBC, Wet Prep HPF POC: <10 (ref <10)
Yeast Wet Prep HPF POC: NONE SEEN
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
## 2021-08-25 NOTE — Discharge Instructions (Addendum)
Return to MAU: If you have heavier bleeding that soaks through more that 2 pads per hour for an hour or more - it is unlikely the bleeding is coming from vagina If you bleed so much that you feel like you might pass out or you do pass out If you have significant abdominal pain that is not improved with Tylenol 1000 mg every 8 hours as needed for pain If you develop a fever > 100.5

## 2021-08-25 NOTE — MAU Note (Signed)
[name] is a 41 y.o. at [redacted]w[redacted]d here in MAU reporting: she's spotting with wiping, reports spotting started approximately 1 hour ago.  Denies passing clots.  Last intercourse 2-3 days ago.  Denies LOF.  Endorses +FM, less than usual today.  Onset of complaint: today Pain score: 8/10 Vitals:   08/25/21 1531  BP: 124/74  Pulse: 84  Resp: 19  Temp: 98.7 F (37.1 C)  SpO2: 100%     [id number] Lab orders placed from triage:   UA

## 2021-08-26 ENCOUNTER — Ambulatory Visit: Payer: 59 | Admitting: Physical Therapy

## 2021-08-26 LAB — GC/CHLAMYDIA PROBE AMP (~~LOC~~) NOT AT ARMC
Chlamydia: NEGATIVE
Comment: NEGATIVE
Comment: NORMAL
Neisseria Gonorrhea: NEGATIVE

## 2021-09-01 ENCOUNTER — Ambulatory Visit: Payer: 59 | Admitting: Physical Therapy

## 2021-09-08 ENCOUNTER — Ambulatory Visit: Payer: 59 | Admitting: Physical Therapy

## 2021-09-08 DIAGNOSIS — R2689 Other abnormalities of gait and mobility: Secondary | ICD-10-CM

## 2021-09-08 DIAGNOSIS — R2681 Unsteadiness on feet: Secondary | ICD-10-CM | POA: Diagnosis not present

## 2021-09-08 DIAGNOSIS — R260 Ataxic gait: Secondary | ICD-10-CM

## 2021-09-08 DIAGNOSIS — M6281 Muscle weakness (generalized): Secondary | ICD-10-CM
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
## 2021-09-10 ENCOUNTER — Ambulatory Visit (INDEPENDENT_AMBULATORY_CARE_PROVIDER_SITE_OTHER): Payer: 59

## 2021-09-10 MED ORDER — BETAMETHASONE SOD PHOS & ACET 6 (3-3) MG/ML IJ SUSP
12.0000 mg | Freq: Once | INTRAMUSCULAR | Status: AC
  Administered 2021-09-10: 12 mg via INTRAMUSCULAR

## 2021-09-10 NOTE — Progress Notes (Signed)
Prudy Feeler here for Betamethasone Injection due to premature labor. BP WNL. Offered FHR doppler, patient declines. Injection administered without complication. Offered nurse visit tomorrow at 10:40 AM at [facility] approved by Mindi Slicker, DO for 2nd injection. Patient declines and states she would prefer to wait full 24 hours. Patient will return in 24 hours to the MAU for next injection.   Marjo Bicker, RN 09/10/2021  4:32 PM

## 2021-09-11 ENCOUNTER — Other Ambulatory Visit: Payer: Self-pay

## 2021-09-11 ENCOUNTER — Ambulatory Visit: Payer: 59

## 2021-09-11 ENCOUNTER — Inpatient Hospital Stay (HOSPITAL_COMMUNITY)
Admission: AD | Admit: 2021-09-11 | Discharge: 2021-09-11 | Disposition: A | Discharge status: Home / Self Care | Payer: 59 | Attending: Family Medicine | Admitting: Family Medicine

## 2021-09-11 DIAGNOSIS — O09523 Supervision of elderly multigravida, third trimester: Secondary | ICD-10-CM | POA: Insufficient documentation

## 2021-09-11 DIAGNOSIS — O26893 Other specified pregnancy related conditions, third trimester: Secondary | ICD-10-CM | POA: Diagnosis not present

## 2021-09-11 DIAGNOSIS — Z3A33 33 weeks gestation of pregnancy: Secondary | ICD-10-CM | POA: Insufficient documentation

## 2021-09-11 DIAGNOSIS — Z3493 Encounter for supervision of normal pregnancy, unspecified, third trimester: Secondary | ICD-10-CM

## 2021-09-11 MED ORDER — BETAMETHASONE SOD PHOS & ACET 6 (3-3) MG/ML IJ SUSP
12.0000 mg | Freq: Once | INTRAMUSCULAR | Status: AC
  Administered 2021-09-11: 12 mg via INTRAMUSCULAR
  Filled 2021-09-11: qty 5

## 2021-09-11 NOTE — MAU Provider Note (Signed)
Event Date/Time   First Provider Initiated Contact with Patient 09/11/21 1551      S [name] is a 41 y.o. [id number] patient who presents to MAU today for BMZ 2 of 2. Patient denies questions, concerns or complaints.   O BP 119/71 (BP Location: Right Arm)   Pulse 87   Temp 98.4 F (36.9 C)   Resp 18   Ht 5\' 7"  (1.702 m)   Wt 93.1 kg   SpO2 99%   BMI 32.15 kg/m   Physical Exam Vitals and nursing note reviewed. Exam conducted with a chaperone present.  Constitutional:      Appearance: Normal appearance.  Cardiovascular:     Rate and Rhythm: Normal rate.  Pulmonary:     Effort: Pulmonary effort is normal.  Abdominal:     Comments: Gravid  Neurological:     Mental Status: She is alert.    A Medical screening exam complete BMZ 2 of 2 given without difficulty  P Discharge from MAU in stable condition Warning signs for worsening condition that would warrant emergency follow-up discussed Patient may return to MAU as needed   , Calvert Cantor 09/11/2021 5:58 PM

## 2021-09-11 NOTE — MAU Note (Signed)
[name] is a 41 y.o. at [redacted]w[redacted]d here in MAU reporting: she's here to receive 2nd dose BMZ.  Denies pain, LOF.  Endorses +FM.  Onset of complaint: N/A Pain score: 0 Vitals:   09/11/21 1546  BP: 119/71  Pulse: 87  Resp: 18  Temp: 98.4 F (36.9 C)  SpO2: 99%     FHT:133 bpm Lab orders placed from triage:   BMZ

## 2021-09-16 LAB — OB RESULTS CONSOLE GBS: GBS: NEGATIVE
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
## 2021-10-02 NOTE — Patient Instructions (Signed)
[name]  10/02/2021   Your procedure is scheduled on:  10/19/2021  Arrive at 0800 at Entrance C on [employer] at [facility]  and [employer]. You are invited to use the FREE valet parking or use the Visitor's parking deck.  Pick up the phone at the desk and dial [phone].  Call this number if you have problems the morning of surgery: [phone]  Remember:   Do not eat food:(After Midnight) Desps de medianoche.  Do not drink clear liquids: (After Midnight) Desps de medianoche.  Take these medicines the morning of surgery with A SIP OF WATER:  none   Do not wear jewelry, make-up or nail polish.  Do not wear lotions, powders, or perfumes. Do not wear deodorant.  Do not shave 48 hours prior to surgery.  Do not bring valuables to the hospital.  [facility] is not   responsible for any belongings or valuables brought to the hospital.  Contacts, dentures or bridgework may not be worn into surgery.  Leave suitcase in the car. After surgery it may be brought to your room.  For patients admitted to the hospital, checkout time is 11:00 AM the day of              discharge.      Please read over the following fact sheets that you were given:     Preparing for Surgery

## 2021-10-05 ENCOUNTER — Encounter (HOSPITAL_COMMUNITY): Payer: Self-pay

## 2021-10-05 ENCOUNTER — Telehealth (HOSPITAL_COMMUNITY): Payer: Self-pay | Admitting: *Deleted

## 2021-10-05 NOTE — Telephone Encounter (Signed)
Preadmission screen  

## 2021-10-07 ENCOUNTER — Ambulatory Visit (INDEPENDENT_AMBULATORY_CARE_PROVIDER_SITE_OTHER): Payer: 59 | Admitting: Neurology

## 2021-10-07 ENCOUNTER — Encounter: Payer: Self-pay | Admitting: Neurology

## 2021-10-07 VITALS — BP 125/75 | HR 69 | Ht 67.0 in | Wt 209.5 lb

## 2021-10-07 DIAGNOSIS — G379 Demyelinating disease of central nervous system, unspecified: Secondary | ICD-10-CM | POA: Diagnosis not present

## 2021-10-07 DIAGNOSIS — G378 Other specified demyelinating diseases of central nervous system: Secondary | ICD-10-CM | POA: Diagnosis not present

## 2021-10-07 DIAGNOSIS — R260 Ataxic gait: Secondary | ICD-10-CM | POA: Diagnosis not present

## 2021-10-07 DIAGNOSIS — H469 Unspecified optic neuritis: Secondary | ICD-10-CM | POA: Diagnosis not present

## 2021-10-07 DIAGNOSIS — M25551 Pain in right hip: Secondary | ICD-10-CM
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
## 2021-10-08 ENCOUNTER — Encounter (HOSPITAL_COMMUNITY): Payer: Self-pay

## 2021-10-16 ENCOUNTER — Encounter (HOSPITAL_COMMUNITY)
Admission: RE | Admit: 2021-10-16 | Discharge: 2021-10-16 | Disposition: A | Discharge status: Home / Self Care | Payer: 59 | Source: Ambulatory Visit | Attending: Obstetrics and Gynecology | Admitting: Obstetrics and Gynecology

## 2021-10-16 ENCOUNTER — Encounter (HOSPITAL_COMMUNITY): Payer: Self-pay | Admitting: Obstetrics and Gynecology

## 2021-10-16 VITALS — Ht 67.0 in | Wt 208.0 lb

## 2021-10-16 DIAGNOSIS — O34219 Maternal care for unspecified type scar from previous cesarean delivery: Secondary | ICD-10-CM | POA: Insufficient documentation

## 2021-10-16 DIAGNOSIS — Z01812 Encounter for preprocedural laboratory examination: Secondary | ICD-10-CM | POA: Insufficient documentation

## 2021-10-16 HISTORY — DX: Myelin oligodendrocyte glycoprotein antibody disease: G37.81

## 2021-10-16 HISTORY — DX: Other specified demyelinating diseases of central nervous system: G37.8

## 2021-10-16 LAB — TYPE AND SCREEN
ABO/RH(D): B POS
Antibody Screen: NEGATIVE

## 2021-10-16 LAB — CBC
HCT: 34.6 % — ABNORMAL LOW (ref 36.0–46.0)
Hemoglobin: 10.9 g/dL — ABNORMAL LOW (ref 12.0–15.0)
MCH: 27.5 pg (ref 26.0–34.0)
MCHC: 31.5 g/dL (ref 30.0–36.0)
MCV: 87.2 fL (ref 80.0–100.0)
Platelets: 331 10*3/uL (ref 150–400)
RBC: 3.97 MIL/uL (ref 3.87–5.11)
RDW: 13.7 % (ref 11.5–15.5)
WBC: 7.3 10*3/uL (ref 4.0–10.5)
nRBC: 0 % (ref 0.0–0.2)

## 2021-10-16 NOTE — Anesthesia Preprocedure Evaluation (Addendum)
Anesthesia Evaluation  Patient identified by MRN, date of birth, ID band Patient awake    Reviewed: Allergy & Precautions, Patient's Chart, lab work & pertinent test results  Airway Mallampati: II  TM Distance: >3 FB Neck ROM: Full    Dental no notable dental hx. (+) Teeth Intact, Dental Advisory Given   Pulmonary neg pulmonary ROS,    Pulmonary exam normal breath sounds clear to auscultation       Cardiovascular negative cardio ROS Normal cardiovascular exam Rhythm:Regular Rate:Normal     Neuro/Psych PSYCHIATRIC DISORDERS Depression Myelin Oligodendrocyte Glycoprotein Antibody disorder Hx/o optic neuritis Recent onset of ataxic gait  Neuromuscular disease    GI/Hepatic Neg liver ROS, GERD  ,Hx/o Laparoscopic gastric banding   Endo/Other  Obesity  Renal/GU negative Renal ROS  negative genitourinary   Musculoskeletal negative musculoskeletal ROS (+)   Abdominal (+) + obese,   Peds  Hematology  (+) Blood dyscrasia, anemia ,   Anesthesia Other Findings   Reproductive/Obstetrics (+) Pregnancy Previous C/Section x 2 AMA                           Anesthesia Physical Anesthesia Plan  ASA: 3  Anesthesia Plan: Epidural   Post-op Pain Management: Tylenol PO (pre-op)*   Induction: Intravenous, Rapid sequence and Cricoid pressure planned  PONV Risk Score and Plan: 4 or greater and Treatment may vary due to age or medical condition and Ondansetron  Airway Management Planned: Natural Airway  Additional Equipment: None  Intra-op Plan:   Post-operative Plan: Extubation in OR  Informed Consent: I have reviewed the patients History and Physical, chart, labs and discussed the procedure including the risks, benefits and alternatives for the proposed anesthesia with the patient or authorized representative who has indicated his/her understanding and acceptance.     Dental advisory  given  Plan Discussed with: Anesthesiologist and CRNA  Anesthesia Plan Comments:        Anesthesia Quick Evaluation

## 2021-10-17 LAB — RPR: RPR Ser Ql: NONREACTIVE

## 2021-10-19 ENCOUNTER — Encounter (HOSPITAL_COMMUNITY): Payer: Self-pay | Admitting: Obstetrics and Gynecology

## 2021-10-19 ENCOUNTER — Inpatient Hospital Stay (HOSPITAL_COMMUNITY)
Admission: RE | Admit: 2021-10-19 | Discharge: 2021-10-21 | DRG: 787 | Disposition: A | Discharge status: Home / Self Care | Payer: 59 | Attending: Obstetrics and Gynecology | Admitting: Obstetrics and Gynecology

## 2021-10-19 ENCOUNTER — Encounter (HOSPITAL_COMMUNITY)
Admission: RE | Disposition: A | Discharge status: Home / Self Care | Payer: Self-pay | Source: Home / Self Care | Attending: Obstetrics and Gynecology

## 2021-10-19 ENCOUNTER — Inpatient Hospital Stay (HOSPITAL_COMMUNITY): Payer: 59 | Admitting: Anesthesiology

## 2021-10-19 ENCOUNTER — Telehealth: Payer: Self-pay | Admitting: Neurology

## 2021-10-19 ENCOUNTER — Other Ambulatory Visit: Payer: Self-pay

## 2021-10-19 DIAGNOSIS — O99844 Bariatric surgery status complicating childbirth: Secondary | ICD-10-CM | POA: Diagnosis present

## 2021-10-19 DIAGNOSIS — G373 Acute transverse myelitis in demyelinating disease of central nervous system: Secondary | ICD-10-CM | POA: Diagnosis present

## 2021-10-19 DIAGNOSIS — Z3A39 39 weeks gestation of pregnancy: Secondary | ICD-10-CM

## 2021-10-19 DIAGNOSIS — O99354 Diseases of the nervous system complicating childbirth: Secondary | ICD-10-CM | POA: Diagnosis present

## 2021-10-19 DIAGNOSIS — Z98891 History of uterine scar from previous surgery: Secondary | ICD-10-CM

## 2021-10-19 DIAGNOSIS — O99214 Obesity complicating childbirth: Secondary | ICD-10-CM | POA: Diagnosis present

## 2021-10-19 DIAGNOSIS — Z3A37 37 weeks gestation of pregnancy: Secondary | ICD-10-CM

## 2021-10-19 DIAGNOSIS — O34219 Maternal care for unspecified type scar from previous cesarean delivery: Principal | ICD-10-CM

## 2021-10-19 DIAGNOSIS — O34211 Maternal care for low transverse scar from previous cesarean delivery: Principal | ICD-10-CM | POA: Diagnosis present

## 2021-10-19 DIAGNOSIS — G379 Demyelinating disease of central nervous system, unspecified: Secondary | ICD-10-CM | POA: Diagnosis not present

## 2021-10-19 DIAGNOSIS — O99892 Other specified diseases and conditions complicating childbirth: Secondary | ICD-10-CM | POA: Diagnosis not present

## 2021-10-19 LAB — ABO/RH: ABO/RH(D): B POS

## 2021-10-19 SURGERY — Surgical Case
Anesthesia: Epidural

## 2021-10-19 MED ORDER — SCOPOLAMINE 1 MG/3DAYS TD PT72
MEDICATED_PATCH | TRANSDERMAL | Status: AC
  Filled 2021-10-19: qty 1

## 2021-10-19 MED ORDER — PRENATAL MULTIVITAMIN CH
1.0000 | ORAL_TABLET | Freq: Every day | ORAL | Status: DC
Start: 2021-10-19 — End: 2021-10-21
  Administered 2021-10-20 – 2021-10-21 (×2): 1 via ORAL
  Filled 2021-10-19 (×2): qty 1

## 2021-10-19 MED ORDER — PHENYLEPHRINE 80 MCG/ML (10ML) SYRINGE FOR IV PUSH (FOR BLOOD PRESSURE SUPPORT)
PREFILLED_SYRINGE | INTRAVENOUS | Status: AC
  Filled 2021-10-19: qty 10

## 2021-10-19 MED ORDER — DIPHENHYDRAMINE HCL 25 MG PO CAPS: 25.0000 mg | ORAL_CAPSULE | Freq: Four times a day (QID) | ORAL | Status: DC | PRN

## 2021-10-19 MED ORDER — IBUPROFEN 600 MG PO TABS
600.0000 mg | ORAL_TABLET | Freq: Four times a day (QID) | ORAL | Status: DC
  Administered 2021-10-20 – 2021-10-21 (×5): 600 mg via ORAL
  Filled 2021-10-19 (×5): qty 1

## 2021-10-19 MED ORDER — CEFAZOLIN SODIUM-DEXTROSE 2-4 GM/100ML-% IV SOLN
INTRAVENOUS | Status: AC
  Filled 2021-10-19: qty 100

## 2021-10-19 MED ORDER — COCONUT OIL OIL: 1.0000 | TOPICAL_OIL | Status: DC | PRN

## 2021-10-19 MED ORDER — DEXAMETHASONE SODIUM PHOSPHATE 4 MG/ML IJ SOLN
INTRAMUSCULAR | Status: DC | PRN
  Administered 2021-10-19: 5 mg via INTRAVENOUS

## 2021-10-19 MED ORDER — SODIUM CHLORIDE 0.9% FLUSH: 3.0000 mL | INTRAVENOUS | Status: DC | PRN

## 2021-10-19 MED ORDER — ACETAMINOPHEN 10 MG/ML IV SOLN
INTRAVENOUS | Status: AC
  Filled 2021-10-19: qty 100

## 2021-10-19 MED ORDER — METOCLOPRAMIDE HCL 5 MG/ML IJ SOLN
INTRAMUSCULAR | Status: AC
  Filled 2021-10-19: qty 2

## 2021-10-19 MED ORDER — KETOROLAC TROMETHAMINE 30 MG/ML IJ SOLN: 30.0000 mg | Freq: Four times a day (QID) | INTRAMUSCULAR | Status: AC | PRN

## 2021-10-19 MED ORDER — SODIUM BICARBONATE 8.4 % IV SOLN
INTRAVENOUS | Status: AC
  Filled 2021-10-19: qty 50

## 2021-10-19 MED ORDER — OXYCODONE HCL 5 MG PO TABS
5.0000 mg | ORAL_TABLET | ORAL | Status: DC | PRN
  Administered 2021-10-20 (×2): 5 mg via ORAL
  Administered 2021-10-21: 10 mg via ORAL
  Filled 2021-10-19: qty 1
  Filled 2021-10-19: qty 2
  Filled 2021-10-19: qty 1

## 2021-10-19 MED ORDER — ONDANSETRON HCL 4 MG/2ML IJ SOLN
INTRAMUSCULAR | Status: DC | PRN
  Administered 2021-10-19: 4 mg via INTRAVENOUS

## 2021-10-19 MED ORDER — KETOROLAC TROMETHAMINE 30 MG/ML IJ SOLN
30.0000 mg | Freq: Once | INTRAMUSCULAR | Status: AC
  Administered 2021-10-19: 30 mg via INTRAVENOUS

## 2021-10-19 MED ORDER — ONDANSETRON HCL 4 MG/2ML IJ SOLN
INTRAMUSCULAR | Status: AC
  Filled 2021-10-19: qty 2

## 2021-10-19 MED ORDER — ONDANSETRON HCL 4 MG/2ML IJ SOLN
4.0000 mg | Freq: Three times a day (TID) | INTRAMUSCULAR | Status: DC | PRN
  Administered 2021-10-19: 4 mg via INTRAVENOUS
  Filled 2021-10-19: qty 2

## 2021-10-19 MED ORDER — NALOXONE HCL 4 MG/10ML IJ SOLN: 1.0000 ug/kg/h | INTRAVENOUS | Status: DC | PRN

## 2021-10-19 MED ORDER — FENTANYL CITRATE (PF) 100 MCG/2ML IJ SOLN: 25.0000 ug | INTRAMUSCULAR | Status: DC | PRN

## 2021-10-19 MED ORDER — CEFAZOLIN SODIUM-DEXTROSE 2-4 GM/100ML-% IV SOLN
2.0000 g | Freq: Once | INTRAVENOUS | Status: AC
  Administered 2021-10-19: 2 g via INTRAVENOUS

## 2021-10-19 MED ORDER — KETOROLAC TROMETHAMINE 30 MG/ML IJ SOLN
30.0000 mg | Freq: Four times a day (QID) | INTRAMUSCULAR | Status: AC | PRN
  Administered 2021-10-19 – 2021-10-20 (×3): 30 mg via INTRAVENOUS
  Filled 2021-10-19 (×3): qty 1

## 2021-10-19 MED ORDER — MORPHINE SULFATE (PF) 0.5 MG/ML IJ SOLN
INTRAMUSCULAR | Status: DC | PRN
  Administered 2021-10-19: 3 mg via EPIDURAL

## 2021-10-19 MED ORDER — SODIUM CHLORIDE 0.9 % IR SOLN
Status: DC | PRN
  Administered 2021-10-19: 1

## 2021-10-19 MED ORDER — KETOROLAC TROMETHAMINE 30 MG/ML IJ SOLN
INTRAMUSCULAR | Status: AC
  Filled 2021-10-19: qty 1

## 2021-10-19 MED ORDER — ACETAMINOPHEN 500 MG PO TABS
1000.0000 mg | ORAL_TABLET | Freq: Four times a day (QID) | ORAL | Status: DC
  Administered 2021-10-19 – 2021-10-21 (×8): 1000 mg via ORAL
  Filled 2021-10-19 (×8): qty 2

## 2021-10-19 MED ORDER — ZOLPIDEM TARTRATE 5 MG PO TABS: 5.0000 mg | ORAL_TABLET | Freq: Every evening | ORAL | Status: DC | PRN

## 2021-10-19 MED ORDER — OXYTOCIN-SODIUM CHLORIDE 30-0.9 UT/500ML-% IV SOLN: 2.5000 [IU]/h | INTRAVENOUS | Status: AC

## 2021-10-19 MED ORDER — SIMETHICONE 80 MG PO CHEW
80.0000 mg | CHEWABLE_TABLET | Freq: Three times a day (TID) | ORAL | Status: DC
  Administered 2021-10-19 – 2021-10-21 (×4): 80 mg via ORAL
  Filled 2021-10-19 (×6): qty 1

## 2021-10-19 MED ORDER — SCOPOLAMINE 1 MG/3DAYS TD PT72
1.0000 | MEDICATED_PATCH | TRANSDERMAL | Status: DC
Start: 2021-10-19 — End: 2021-10-21
  Administered 2021-10-19: 1.5 mg via TRANSDERMAL

## 2021-10-19 MED ORDER — LIDOCAINE-EPINEPHRINE (PF) 2 %-1:200000 IJ SOLN
INTRAMUSCULAR | Status: AC
  Filled 2021-10-19: qty 20

## 2021-10-19 MED ORDER — TETANUS-DIPHTH-ACELL PERTUSSIS 5-2.5-18.5 LF-MCG/0.5 IM SUSY: 0.5000 mL | PREFILLED_SYRINGE | Freq: Once | INTRAMUSCULAR | Status: DC

## 2021-10-19 MED ORDER — DIPHENHYDRAMINE HCL 50 MG/ML IJ SOLN: 12.5000 mg | INTRAMUSCULAR | Status: DC | PRN

## 2021-10-19 MED ORDER — MORPHINE SULFATE (PF) 0.5 MG/ML IJ SOLN
INTRAMUSCULAR | Status: AC
  Filled 2021-10-19: qty 10

## 2021-10-19 MED ORDER — OXYTOCIN-SODIUM CHLORIDE 30-0.9 UT/500ML-% IV SOLN
INTRAVENOUS | Status: AC
  Filled 2021-10-19: qty 500

## 2021-10-19 MED ORDER — MENTHOL 3 MG MT LOZG: 1.0000 | LOZENGE | OROMUCOSAL | Status: DC | PRN

## 2021-10-19 MED ORDER — NALOXONE HCL 0.4 MG/ML IJ SOLN: 0.4000 mg | INTRAMUSCULAR | Status: DC | PRN

## 2021-10-19 MED ORDER — DIBUCAINE (PERIANAL) 1 % EX OINT
1.0000 | TOPICAL_OINTMENT | CUTANEOUS | Status: DC | PRN
Start: 2021-10-19 — End: 2021-10-21

## 2021-10-19 MED ORDER — GABAPENTIN 100 MG PO CAPS: 100.0000 mg | ORAL_CAPSULE | Freq: Three times a day (TID) | ORAL | Status: DC | PRN

## 2021-10-19 MED ORDER — SIMETHICONE 80 MG PO CHEW: 80.0000 mg | CHEWABLE_TABLET | ORAL | Status: DC | PRN

## 2021-10-19 MED ORDER — DIPHENHYDRAMINE HCL 25 MG PO CAPS: 25.0000 mg | ORAL_CAPSULE | ORAL | Status: DC | PRN

## 2021-10-19 MED ORDER — SENNOSIDES-DOCUSATE SODIUM 8.6-50 MG PO TABS
2.0000 | ORAL_TABLET | Freq: Every day | ORAL | Status: DC
  Administered 2021-10-20 – 2021-10-21 (×2): 2 via ORAL
  Filled 2021-10-19 (×3): qty 2

## 2021-10-19 MED ORDER — LACTATED RINGERS IV SOLN: INTRAVENOUS | Status: DC

## 2021-10-19 MED ORDER — WITCH HAZEL-GLYCERIN EX PADS: 1.0000 | MEDICATED_PAD | CUTANEOUS | Status: DC | PRN

## 2021-10-19 MED ORDER — OXYTOCIN-SODIUM CHLORIDE 30-0.9 UT/500ML-% IV SOLN
INTRAVENOUS | Status: DC | PRN
  Administered 2021-10-19: 200 mL via INTRAVENOUS

## 2021-10-19 MED ORDER — FENTANYL CITRATE (PF) 100 MCG/2ML IJ SOLN
INTRAMUSCULAR | Status: AC
  Filled 2021-10-19: qty 2

## 2021-10-19 MED ORDER — DEXMEDETOMIDINE HCL IN NACL 80 MCG/20ML IV SOLN
INTRAVENOUS | Status: AC
  Filled 2021-10-19: qty 20

## 2021-10-19 MED ORDER — MEPERIDINE HCL 25 MG/ML IJ SOLN: 6.2500 mg | INTRAMUSCULAR | Status: DC | PRN

## 2021-10-19 MED ORDER — ACETAMINOPHEN 10 MG/ML IV SOLN
INTRAVENOUS | Status: DC | PRN
  Administered 2021-10-19: 1000 mg via INTRAVENOUS

## 2021-10-19 MED ORDER — TRANEXAMIC ACID-NACL 1000-0.7 MG/100ML-% IV SOLN
1000.0000 mg | INTRAVENOUS | Status: AC
  Administered 2021-10-19: 1000 mg via INTRAVENOUS

## 2021-10-19 MED ORDER — FENTANYL CITRATE (PF) 100 MCG/2ML IJ SOLN
INTRAMUSCULAR | Status: DC | PRN
Start: 2021-10-19 — End: 2021-10-19
  Administered 2021-10-19: 50 ug via EPIDURAL

## 2021-10-19 MED ORDER — LIDOCAINE-EPINEPHRINE (PF) 2 %-1:200000 IJ SOLN
INTRAMUSCULAR | Status: DC | PRN
  Administered 2021-10-19: 7 mL via INTRADERMAL
  Administered 2021-10-19: 4 mL via INTRADERMAL
  Administered 2021-10-19: 5 mL via INTRADERMAL
  Administered 2021-10-19: 4 mL via INTRADERMAL

## 2021-10-19 MED ORDER — PHENYLEPHRINE HCL (PRESSORS) 10 MG/ML IV SOLN
INTRAVENOUS | Status: DC | PRN
  Administered 2021-10-19 (×2): 160 ug via INTRAVENOUS
  Administered 2021-10-19: 80 ug via INTRAVENOUS
  Administered 2021-10-19 (×2): 160 ug via INTRAVENOUS

## 2021-10-19 MED ORDER — DEXAMETHASONE SODIUM PHOSPHATE 4 MG/ML IJ SOLN
INTRAMUSCULAR | Status: AC
  Filled 2021-10-19: qty 1

## 2021-10-19 MED ORDER — DEXMEDETOMIDINE (PRECEDEX) IN NS 20 MCG/5ML (4 MCG/ML) IV SYRINGE
PREFILLED_SYRINGE | INTRAVENOUS | Status: DC | PRN
  Administered 2021-10-19: 8 ug via INTRAVENOUS

## 2021-10-19 SURGICAL SUPPLY — 36 items
BENZOIN TINCTURE PRP APPL 2/3 (GAUZE/BANDAGES/DRESSINGS) ×1 IMPLANT
CHLORAPREP W/TINT 26ML (MISCELLANEOUS) ×4 IMPLANT
CLAMP CORD UMBIL (MISCELLANEOUS) ×2 IMPLANT
CLOTH BEACON ORANGE TIMEOUT ST (SAFETY) ×2 IMPLANT
DRAPE C SECTION CLR SCREEN (DRAPES) ×2 IMPLANT
DRSG OPSITE POSTOP 4X10 (GAUZE/BANDAGES/DRESSINGS) ×2 IMPLANT
ELECT REM PT RETURN 9FT ADLT (ELECTROSURGICAL) ×2
ELECTRODE REM PT RTRN 9FT ADLT (ELECTROSURGICAL) ×1 IMPLANT
EXTRACTOR VACUUM KIWI (MISCELLANEOUS) IMPLANT
GLOVE BIO SURGEON STRL SZ 6.5 (GLOVE) ×2 IMPLANT
GLOVE BIOGEL PI IND STRL 7.0 (GLOVE) ×2 IMPLANT
GLOVE BIOGEL PI INDICATOR 7.0 (GLOVE) ×2
GOWN STRL REUS W/TWL LRG LVL3 (GOWN DISPOSABLE) ×4 IMPLANT
KIT ABG SYR 3ML LUER SLIP (SYRINGE) IMPLANT
NDL HYPO 25X5/8 SAFETYGLIDE (NEEDLE) IMPLANT
NEEDLE HYPO 25X5/8 SAFETYGLIDE (NEEDLE) IMPLANT
NS IRRIG 1000ML POUR BTL (IV SOLUTION) ×2 IMPLANT
PACK C SECTION WH (CUSTOM PROCEDURE TRAY) ×2 IMPLANT
PAD OB MATERNITY 4.3X12.25 (PERSONAL CARE ITEMS) ×2 IMPLANT
RETRACTOR WND ALEXIS 25 LRG (MISCELLANEOUS) ×1 IMPLANT
RTRCTR C-SECT PINK 25CM LRG (MISCELLANEOUS) IMPLANT
RTRCTR WOUND ALEXIS 25CM LRG (MISCELLANEOUS) ×2
STRIP CLOSURE SKIN 1/2X4 (GAUZE/BANDAGES/DRESSINGS) ×1 IMPLANT
SUT CHROMIC 1 CTX 36 (SUTURE) ×4 IMPLANT
SUT PLAIN 0 NONE (SUTURE) IMPLANT
SUT PLAIN 2 0 XLH (SUTURE) ×2 IMPLANT
SUT VIC AB 0 CT1 27 (SUTURE) ×2
SUT VIC AB 0 CT1 27XBRD ANBCTR (SUTURE) ×2 IMPLANT
SUT VIC AB 2-0 CT1 27 (SUTURE) ×1
SUT VIC AB 2-0 CT1 TAPERPNT 27 (SUTURE) ×1 IMPLANT
SUT VIC AB 3-0 CT1 27 (SUTURE)
SUT VIC AB 3-0 CT1 TAPERPNT 27 (SUTURE) IMPLANT
SUT VIC AB 4-0 KS 27 (SUTURE) ×2 IMPLANT
TOWEL OR 17X24 6PK STRL BLUE (TOWEL DISPOSABLE) ×2 IMPLANT
TRAY FOLEY W/BAG SLVR 14FR LF (SET/KITS/TRAYS/PACK) ×2 IMPLANT
WATER STERILE IRR 1000ML POUR (IV SOLUTION) ×2 IMPLANT

## 2021-10-19 NOTE — Anesthesia Procedure Notes (Signed)
Epidural Patient location during procedure: OB Start time: 10/19/2021 9:43 AM End time: 10/19/2021 9:50 AM  Staffing Anesthesiologist: Mal Amabile, MD Performed: anesthesiologist   Preanesthetic Checklist Completed: patient identified, IV checked, site marked, risks and benefits discussed, surgical consent, monitors and equipment checked, pre-op evaluation and timeout performed  Epidural Patient position: sitting Prep: DuraPrep and site prepped and draped Patient monitoring: continuous pulse ox and blood pressure Approach: midline Location: L3-L4 Injection technique: LOR air  Needle:  Needle type: Tuohy  Needle gauge: 17 G Needle length: 9 cm and 9 Needle insertion depth: 7 cm Catheter type: closed end flexible Catheter size: 19 Gauge Catheter at skin depth: 12 cm Test dose: negative and 2% lidocaine with Epi 1:200 K  Assessment Events: blood not aspirated, injection not painful, no injection resistance, no paresthesia and negative IV test  Additional Notes Patient tolerated procedure well. Adequate sensory level.  Reason for block:surgical anesthesia

## 2021-10-19 NOTE — Telephone Encounter (Signed)
Dr Mal Amabile is asking for a call from Dr Epimenio Foot .  Pt is scheduled for a 10:00 c section, Dr Malen Gauze would like to discuss neurologic issues re: pt, please call him at [phone]

## 2021-10-19 NOTE — Anesthesia Postprocedure Evaluation (Signed)
Anesthesia Post Note  Patient: RAYLINN KOSAR  Procedure(s) Performed: CESAREAN SECTION     Patient location during evaluation: Mother Baby Anesthesia Type: Epidural Level of consciousness: awake and alert and oriented Pain management: pain level controlled Vital Signs Assessment: post-procedure vital signs reviewed and stable Respiratory status: spontaneous breathing, nonlabored ventilation and respiratory function stable Cardiovascular status: stable Postop Assessment: no headache, no backache, epidural receding and patient able to bend at knees Anesthetic complications: no   No notable events documented.  Last Vitals:  Vitals:   10/19/21 1115 10/19/21 1130  BP: (!) 95/55 98/60  Pulse: (!) 58 (!) 52  Resp: 10 15  Temp:    SpO2: 98% 100%    Last Pain:  Vitals:   10/19/21 1130  TempSrc:   PainSc: 0-No pain   Pain Goal:      LLE Sensation: Numbness (10/19/21 1137)   RLE Sensation: Numbness (10/19/21 1137)     Epidural/Spinal Function Cutaneous sensation: Tingles (10/19/21 1107), Patient able to flex knees: No (10/19/21 1137), Patient able to lift hips off bed: No (10/19/21 1137), Back pain beyond tenderness at insertion site: No (10/19/21 1137), Progressively worsening motor and/or sensory loss: No (10/19/21 1137), Bowel and/or bladder incontinence post epidural: No (10/19/21 1137)  Marquia Costello A.

## 2021-10-19 NOTE — Op Note (Signed)
Operative Note    Preoperative Diagnosis:  IUP at 39 1/7wks  Previous cesarean section Myelin Oligodendrocyte glycoprotein antibody disorder ( MOGAD) during pregnancy History of lap band surgery   Postoperative Diagnosis: Same    Procedure: Repeat low transverse cesarean section    Surgeon: Britt Bottom DO Assist: Hubert Azure MD   Anesthesia: Dr Malen Gauze - anesthesiologist/ C Wynn CRNA   Fluids: LR 2L EBL: UOP: 78ml  Findings: Viable female infant in vertex position with ?bilateral talipes equinovarus. APGARS 9,9. Weight pending                   Grossly normal uterus, tubes and ovaries    Specimen: Placenta to L/D   Procedure Note  Consent verified pre-op. All questions answered   Patient was taken to the operating room where epidural anesthesia was administered. She was prepped and draped in the normal sterile fashion and placed in the dorsal supine position with a leftward tilt. An appropriate time out was performed. Anesthesia was confirmed to be adequate. A Pfannenstiel skin incision was then made through the previous incision with the scalpel and carried through to the underlying layer of fascia by sharp dissection and Bovie cautery. The fascia was nicked in the midline and the incision was extended laterally with Mayo scissors. The superior and inferior aspects of the incision were grasped with kocher clamps and dissected off the underlying rectus muscles.Rectus muscles were separated in the midline  and the peritoneal cavity entered bluntly. The peritoneal incision was then extended both superiorly and inferiorly with careful attention to avoid both bowel and bladder. A band of omentum running across the midline was separated hemostatically. The Alexis self-retaining wound retractor was then placed and the lower uterine segment exposed.  The bladder flap was developed with Metzenbaum scissors and pushed away from the lower uterine segment. The lower uterine segment was  then incised in a transverse fashion and the cavity itself entered bluntly. The incision was extended bluntly. The infant's head was then lifted and delivered from the incision without difficulty. Vigorous spontaneous cry was noted during delivery.  The remainder of the infant delivered and the nose and mouth bulb suctioned. The cord was clamped and cut after a minute delay. The infant was handed off to the waiting NICU team. The placenta was then spontaneously expressed from the uterus and the uterus cleared of all clots and debris with moist lap sponge. The uterine incision was then repaired in 2 layers the first layer was a running locked layer 1-0 chromic and the second an imbricating layer of the same suture. The tubes and ovaries were inspected and the gutters cleared of all clots and debris. The uterine incision was inspected and found to be hemostatic. All instruments and sponges were then removed from the abdomen.  The peritoneum was then reapproximated in a running fashion incorporating the rectus muscles with sutures of 2-0 Vicryl. The fascia was then closed with 0 Vicryl in a running fashion. Subcutaneous tissue was reapproximated with 3-0 plain in a running fashion. The skin was closed with a subcuticular stitch of 4-0 Vicryl on a Keith needle and then reinforced with benzoin and Steri-Strips.  At the conclusion of the procedure all instruments and sponge counts were correct. Patient was taken to the recovery room in good condition with her baby accompanying her skin to skin.

## 2021-10-19 NOTE — Transfer of Care (Signed)
Immediate Anesthesia Transfer of Care Note  Patient: [name]  Procedure(s) Performed: CESAREAN SECTION  Patient Location: PACU  Anesthesia Type:Epidural  Level of Consciousness: awake, alert  and oriented  Airway & Oxygen Therapy: Patient Spontanous Breathing  Post-op Assessment: Report given to RN and Post -op Vital signs reviewed and stable  Post vital signs: Reviewed and stable  Last Vitals:  Vitals Value Taken Time  BP 99/45 10/19/21 1107  Temp    Pulse 54 10/19/21 1111  Resp 18 10/19/21 1111  SpO2 99 % 10/19/21 1111  Vitals shown include unvalidated device data.  Last Pain:  Vitals:   10/19/21 0805  TempSrc: Oral         Complications: No notable events documented.

## 2021-10-19 NOTE — Lactation Note (Signed)
This note was copied from a baby's chart. Lactation Consultation Note  Patient Name: [name] [id number] Date: 10/19/2021 Reason for consult: Initial assessment;Term Age:41 hours  P3: Term baby at 39+1 weeks Feeding preference: Breast  Baby "[name]" was asleep in the support person's arms when I arrived.  Birth parent reported that he has latched a couple of times since birth.  She denies pain with latching.  Birth parent remarked that she is determined to have a good breast feeding experience with "[name]."  Apparently, with her last child (35 years old) an Charity fundraiser made a comment that caused birth parent to stop breast feeding.  Reassurance given.  Offered  to return as needed for latch assistance.    Encouraged to continue doing lots of STS, breast massage and hand expression.  Finger feeding/spoon feeding demonstrated.  [name] has voided/stooled since birth.  Allowed time for family bonding when 34 year old son came in to visit.  RN updated.   Maternal Data Has patient been taught Hand Expression?: Yes Does the patient have breastfeeding experience prior to this delivery?: Yes How long did the patient breastfeed?: "Short" time with her daughter (now 56 years old)  Feeding Mother's Current Feeding Choice: Breast Milk  LATCH Score                    Lactation Tools Discussed/Used    Interventions Interventions: Breast feeding basics reviewed;Education;LC Services brochure  Discharge Pump: Personal WIC Program: No  Consult Status Consult Status: Follow-up Date: 10/20/21 Follow-up type: In-patient    Dora Sims 10/19/2021, 5:23 PM

## 2021-10-19 NOTE — Interval H&P Note (Signed)
History and Physical Interval Note: Pt seen. Procedure reviewed and all questions answered No change since H/P done.  To OR when ready Consent confirmed   10/19/2021 9:15 AM  [name]  has presented today for surgery, with the diagnosis of repeat c-section.  The various methods of treatment have been discussed with the patient and family. After consideration of risks, benefits and other options for treatment, the patient has consented to  Procedure(s) with comments: CESAREAN SECTION (N/A) - request RNFA/2nd Scrub tech as a surgical intervention.  The patient's history has been reviewed, patient examined, no change in status, stable for surgery.  I have reviewed the patient's chart and labs.  Questions were answered to the patient's satisfaction.     [name]

## 2021-10-20 ENCOUNTER — Encounter (HOSPITAL_COMMUNITY): Payer: Self-pay | Admitting: Obstetrics and Gynecology

## 2021-10-20 LAB — CBC
HCT: 28.9 % — ABNORMAL LOW (ref 36.0–46.0)
Hemoglobin: 9.5 g/dL — ABNORMAL LOW (ref 12.0–15.0)
MCH: 27.9 pg (ref 26.0–34.0)
MCHC: 32.9 g/dL (ref 30.0–36.0)
MCV: 84.8 fL (ref 80.0–100.0)
Platelets: 267 10*3/uL (ref 150–400)
RBC: 3.41 MIL/uL — ABNORMAL LOW (ref 3.87–5.11)
RDW: 13.7 % (ref 11.5–15.5)
WBC: 12.3 10*3/uL — ABNORMAL HIGH (ref 4.0–10.5)
nRBC: 0 % (ref 0.0–0.2)

## 2021-10-20 NOTE — Progress Notes (Signed)
POSTPARTUM POSTOP PROGRESS NOTE  POD #1  Subjective:  No acute events overnight.  Pt denies problems with ambulating, voiding or po intake.  She denies nausea or vomiting.  Pain is well controlled.  She has had flatus. She has not had bowel movement.  Lochia Minimal.   Objective: Blood pressure 119/79, pulse (!) 52, temperature 98.2 F (36.8 C), temperature source Oral, resp. rate 18, height 5\' 7"  (1.702 m), weight 94.6 kg, SpO2 100 %, unknown if currently breastfeeding.  Physical Exam:  General: alert, cooperative and no distress Lochia:normal flow Chest: CTAB Heart: RRR no m/r/g Abdomen: +BS, soft, nontender Uterine Fundus: firm, 2cm below umbilicus. Honeycomb dressing intact, some serosanguinous dressing at inferior edge drainage Extremities: neg edema, neg calf TTP BL, neg Homans BL  Recent Labs    10/20/21 0451  HGB 9.5*  HCT 28.9*    Assessment/Plan:  ASSESSMENT: BELKIS NORBECK is a 41 y.o. G3P3003 s/p ERLTCS @ [redacted]w[redacted]d for h/o csx x1. PNC c/b MOGAD, h/o lap band.   Plan for discharge tomorrow, Breastfeeding, and Circumcision prior to discharge Circ tomorrow given infant weight and working on BF< latch seems to be improving   LOS: 1 day

## 2021-10-20 NOTE — Lactation Note (Signed)
This note was copied from a baby's chart. Lactation Consultation Note  Patient Name: [name] NGEXB'M Date: 10/20/2021 Reason for consult: Follow-up assessment;Term, C/S delivery. Age P3, :31 hours, term female infant -4% weight loss.  Birth parent finished BF infant for 20 minutes on her right breast, infant was still cuing to feed, Birth Parent latched infant on left breast using the cradle hold postion, infant latched with depth, swallows observed, infant was still BF after 35 mins. when LC left the room. Birth Parent knows if infant is still cuing after latching on the 1st breast to offer the 2nd breast during the same feeding. Birth Parent understands infant is cluster feeding and this is normal 2nd day of life. LC reviewed hand expressing using breast model, Birth Parent knows after latching infant at the breast, she can hand express and give infant back EBM. Birth Parent knows to call RN/LC if their are any BF questions, concerns or need latch assistance.  Maternal Data    Feeding Mother's Current Feeding Choice: Breast Milk  LATCH Score Latch: Grasps breast easily, tongue down, lips flanged, rhythmical sucking.  Audible Swallowing: Spontaneous and intermittent  Type of Nipple: Everted at rest and after stimulation  Comfort (Breast/Nipple): Soft / non-tender  Hold (Positioning): Assistance needed to correctly position infant at breast and maintain latch.  LATCH Score: 9   Lactation Tools Discussed/Used    Interventions Interventions: Skin to skin;Support pillows;Breast compression;Education  Discharge    Consult Status Consult Status: Follow-up Date: 10/21/21 Follow-up type: In-patient    Danelle Earthly 10/20/2021, 5:40 PM

## 2021-10-20 NOTE — Social Work (Signed)
CSW acknowledged consult and completed a clinical assessment.  There are no barriers to d/c.  Clinical assessment notes will be entered at a later time.  Vivi Barrack, MSW, LCSW Women[facility] and [facility]  Clinical Social Worker  [phone] 10/20/2021  4:47 PM

## 2021-10-20 NOTE — Progress Notes (Signed)
PT Cancellation Note  Patient Details Name: [name] MRN: [id number] DOB: 05-06-1980   Cancelled Treatment:    Reason Eval/Treat Not Completed: PT screened, no needs identified, will sign off. Pt mobilizing independently without difficulty.    Angelina Ok [facility] 10/20/2021, 3:17 PM Skip Mayer PT Acute [employer] [phone]

## 2021-10-21 MED ORDER — OXYCODONE HCL 5 MG PO TABS: 5.0000 mg | ORAL_TABLET | ORAL | 0 refills | Status: DC | PRN

## 2021-10-21 NOTE — Progress Notes (Signed)
Patinet passing gas.

## 2021-10-21 NOTE — Social Work (Signed)
CSW received consult for Mother of infant diagnosed with neurological disorder during pregnancy (MOGAD) with visual and gait disturbances and anxiety. CSW met with MOB to offer support and complete assessment.    CSW met with MOB at bedside and introduced CSW role. CSW observed MOB sitting in bed and FOB present at bedside holding the infant. CSW offered MOB privacy. MOB gave CSW permission to share all information with FOB present. MOB presented pleasant and welcomed CSW visit. CSW inquired how MOB has been feeling since giving birth. MOB reported that she feels "fine and well." MOB shared that she had a planned c-section, and the process went well with great support from the medical staff. CSW inquired how MOB felt during the pregnancy. 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 She reported with physical therapy sessions and close monitoring by her neurologist (Dr. Arlice Colt) she has been "feeling fine." MOB expressed "he (Dr. Felecia Shelling) takes good care of me." MOB reported no current concerns with gait mobility and vision disturbances. She shared that she did have an increase in anxiety that she did not have prior to the pregnancy. However, with family support and improvement in her health she is not anxious. MOB reported she has been seeing therapist "Deeann Saint at [facility] since 2021 for life support" which she feels is very helpful. MOB reported she will continue to see her therapist during the postpartum period. MOB identified her children, spouse and in laws as her primary supports.   CSW discussed PPD symptoms. MOB shared that she experienced PPD twenty years ago following the birth of her son. MOB reported she was a single parent with limited support. MOB reported she does not have those same stressors. CSW provided  education regarding the baby blues period vs. perinatal mood disorders, discussed treatment and gave resources for mental health follow up if concerns arise.  CSW recommended MOB complete a self-evaluation during the postpartum time period using the New Mom Checklist from Postpartum Progress and encouraged MOB to contact a medical professional if symptoms are noted at any time. CSW assessed MOB for safety. MOB denied thoughts of harm to self and others.    MOB reported that she has essential items for the infant including a bassinet where the infant will sleep. MOB has chosen [employer] for the infant's follow up care CSW provided review of Sudden Infant Death Syndrome (SIDS) precautions. MOB reported understanding. CSW assessed MOB for additional needs. MOB reported no further need.   CSW identifies no further need for intervention and no barriers to discharge at this time.    Kathrin Greathouse, MSW, LCSW Women's and [place] Worker  [phone] 10/21/2021  10:43 AM

## 2021-10-21 NOTE — Discharge Summary (Signed)
Postpartum Discharge Summary     Patient Name: [name] DOB: 07/28/1980 MRN: [id number]  Date of admission: 10/19/2021 Delivery date:10/19/2021  Delivering provider: Pryor Ochoa [facility]  Date of discharge: 10/21/2021  Admitting diagnosis: Status post repeat low transverse cesarean section [Z98.891] Intrauterine pregnancy: [redacted]w[redacted]d     Secondary diagnosis:  Principal Problem:   Status post repeat low transverse cesarean section Active Problems:   Encounter for maternal care for low transverse scar from repeat cesarean delivery  Additional problems: IVF pregnancy, 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    Discharge diagnosis: Term Pregnancy Delivered                                              Post partum procedures: none Augmentation: N/A Complications: None  Hospital course: Sceduled C/S   41 y.o. yo G3P3003 at [redacted]w[redacted]d was admitted to the hospital 10/19/2021 for scheduled cesarean section with the following indication:Elective Repeat.Delivery details are as follows:  Membrane Rupture Time/Date: 10:17 AM ,10/19/2021   Delivery Method:C-Section, Low Transverse  Details of operation can be found in separate operative note.  Patient had an uncomplicated postpartum course.  She is ambulating, tolerating a regular diet, passing flatus, and urinating well. Patient is discharged home in stable condition on  10/21/21        Newborn Data: Birth date:10/19/2021  Birth time:10:18 AM  Gender:Female  Living status:Living  Apgars:7 ,8  Weight:3350 g     Magnesium Sulfate received: No BMZ received: Yes Rhophylac:N/A Transfusion:No  Physical exam  Vitals:   10/20/21 1431 10/20/21 2111 10/21/21 0548 10/21/21 1206  BP: (!) 108/55 128/79 111/71 130/77  Pulse: (!) 53 61 60 73  Resp: 16 16 16 18   Temp: 98 F (36.7 C) 98.3 F (36.8 C) 98.2 F (36.8 C) 98 F (36.7 C)  TempSrc: Oral Oral Oral   SpO2: 100% 100% 99% 99%  Weight:       Height:       General: alert, cooperative, and no distress Lochia: appropriate Uterine Fundus: firm Incision: Healing well with no significant drainage, No significant erythema, Dressing is clean, dry, and intact DVT Evaluation: No evidence of DVT seen on physical exam. Labs: Lab Results  Component Value Date   WBC 12.3 (H) 10/20/2021   HGB 9.5 (L) 10/20/2021   HCT 28.9 (L) 10/20/2021   MCV 84.8 10/20/2021   PLT 267 10/20/2021      Latest Ref Rng & Units 06/26/2021    9:29 PM  CMP  Glucose 70 - 99 mg/dL 95   BUN 6 - 20 mg/dL 14   Creatinine 06/28/2021 - 1.00 mg/dL 9.50   Sodium 9.32 - 671 mmol/L 136   Potassium 3.5 - 5.1 mmol/L 3.7   Chloride 98 - 111 mmol/L 104   CO2 22 - 32 mmol/L 23   Calcium 8.9 - 10.3 mg/dL 9.0   Total Protein 6.5 - 8.1 g/dL 6.9   Total Bilirubin 0.3 - 1.2 mg/dL 0.4   Alkaline Phos 38 - 126 U/L 40   AST 15 - 41 U/L 16   ALT 0 - 44 U/L 14    Edinburgh Score:    10/21/2021    9:28 AM  Edinburgh Postnatal Depression Scale Screening Tool  I have been able to laugh and see  the funny side of things. 0  I have looked forward with enjoyment to things. 0  I have blamed myself unnecessarily when things went wrong. 1  I have been anxious or worried for no good reason. 1  I have felt scared or panicky for no good reason. 0  Things have been getting on top of me. 1  I have been so unhappy that I have had difficulty sleeping. 0  I have felt sad or miserable. 0  I have been so unhappy that I have been crying. 0  The thought of harming myself has occurred to me. 0  Edinburgh Postnatal Depression Scale Total 3      After visit meds:  Allergies as of 10/21/2021   No Known Allergies      Medication List     STOP taking these medications    cyclobenzaprine 10 MG tablet Commonly known as: FLEXERIL   gabapentin 100 MG capsule Commonly known as: NEURONTIN   prenatal multivitamin Tabs tablet       TAKE these medications    oxyCODONE 5 MG immediate  release tablet Commonly known as: Oxy IR/ROXICODONE Take 1-2 tablets (5-10 mg total) by mouth every 4 (four) hours as needed for moderate pain.               Discharge Care Instructions  (From admission, onward)           Start     Ordered   10/21/21 0000  Discharge wound care:       Comments: Can remove dressing and steri strips 5 days from date of surgery   10/21/21 1443             Discharge home in stable condition  Infant Disposition:home with mother Discharge instruction: per After Visit Summary and Postpartum booklet. Activity: Advance as tolerated. Pelvic rest for 6 weeks.  Diet: routine diet Anticipated Birth Control: Unsure Postpartum Appointment:2 weeks Additional Postpartum F/U: Postpartum Depression checkup and Incision check as above Future Appointments: Future Appointments  Date Time Provider Department Center  02/24/2022  3:00 PM Sater, Pearletha Furl, MD [facility] None   Follow up Visit:  Follow-up Information     Edwinna Areola, DO Follow up in 2 week(s).   Specialty: Obstetrics and Gynecology Contact information: [address] [place] [address] [phone]                     10/21/2021 Philip Aspen, DO

## 2021-10-21 NOTE — Progress Notes (Signed)
MD stated she can go home today if she passes gas. Patient ambulated halls. Patient told to let RN know when she passes gas.

## 2021-10-21 NOTE — Lactation Note (Signed)
This note was copied from a baby's chart. Lactation Consultation Note  Patient Name: [name] HTDSK'A Date: 10/21/2021 Reason for consult: Follow-up assessment;Term;Infant weight loss (9 % weight loss. post circ) - Dyad is on the D/C list.  Age:41 hours at the Start of the consult , baby in the tx nursery for Circ.  LC reviewed the doc flow sheets with mom and updated. The I/O correlate with  The weight loss of 9 %. Mom reports swallows have increased and the feeding is comfortable.  Baby asleep and ate both breast prior to the circ.  LC reviewed BF D/C teaching and LC resources after D/C. Mom aware she can call for [facility] for latch assessment when baby is showing cues.   Maternal Data    Feeding Mother's Current Feeding Choice: Breast Milk  LATCH Score    Lactation Tools Discussed/Used  Hand pump   Interventions BF teaching    Discharge Discharge Education: Engorgement and breast care;Warning signs for feeding baby Pump: DEBP;Personal;Manual (per mom hands freee and DEBP)  Consult Status Consult status - Follow up  10/21/2021   Matilde Sprang [facility] 10/21/2021, 10:04 AM

## 2021-10-28 ENCOUNTER — Telehealth (HOSPITAL_COMMUNITY): Payer: Self-pay | Admitting: *Deleted

## 2021-10-28 NOTE — Telephone Encounter (Signed)
Left phone voicemail message.  Duffy Rhody, RN 10-28-2021 at 12:13pm

## 2021-11-18 ENCOUNTER — Encounter: Payer: Self-pay | Admitting: Internal Medicine

## 2021-12-01 NOTE — Progress Notes (Signed)
I was called by Dr Terri Piedra to assist with the Cesarean section for this patient.   An experienced assistant was required given the standard of surgical care given the complexity of the case.  This assistant was needed for exposure, dissection, suctioning, retraction, instrument exchange, assisting with delivery with administration of fundal pressure, and for overall help during the procedure.  Liliane Channel MD MPH OB Fellow, [place] for [employer]

## 2021-12-22 ENCOUNTER — Encounter: Payer: Self-pay | Admitting: Internal Medicine

## 2022-02-24 ENCOUNTER — Encounter: Payer: Self-pay | Admitting: Neurology

## 2022-02-24 ENCOUNTER — Ambulatory Visit (INDEPENDENT_AMBULATORY_CARE_PROVIDER_SITE_OTHER): Payer: 59 | Admitting: Neurology

## 2022-02-24 VITALS — BP 141/83 | HR 79 | Ht 67.0 in | Wt 215.0 lb

## 2022-02-24 DIAGNOSIS — Z79899 Other long term (current) drug therapy: Secondary | ICD-10-CM | POA: Diagnosis not present

## 2022-02-24 DIAGNOSIS — H469 Unspecified optic neuritis: Secondary | ICD-10-CM

## 2022-02-24 DIAGNOSIS — G3781 Myelin oligodendrocyte glycoprotein antibody disease: Secondary | ICD-10-CM

## 2022-02-24 DIAGNOSIS — R200 Anesthesia of skin: Secondary | ICD-10-CM
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
## 2022-02-25 ENCOUNTER — Other Ambulatory Visit (INDEPENDENT_AMBULATORY_CARE_PROVIDER_SITE_OTHER): Payer: Self-pay

## 2022-02-25 DIAGNOSIS — Z0289 Encounter for other administrative examinations: Secondary | ICD-10-CM

## 2022-02-25 DIAGNOSIS — G3781 Myelin oligodendrocyte glycoprotein antibody disease: Secondary | ICD-10-CM

## 2022-02-25 DIAGNOSIS — Z79899 Other long term (current) drug therapy: Secondary | ICD-10-CM

## 2022-03-01 ENCOUNTER — Other Ambulatory Visit: Payer: Self-pay | Admitting: Obstetrics and Gynecology

## 2022-03-01 DIAGNOSIS — R928 Other abnormal and inconclusive findings on diagnostic imaging of breast: Secondary | ICD-10-CM

## 2022-03-02 LAB — ANTI-MOG ANTIBODY TITER, SERUM: Anti-MOG Antibody Titer, Serum: 1:320 {titer}

## 2022-03-02 LAB — LIPID PANEL
Chol/HDL Ratio: 2.2 ratio (ref 0.0–4.4)
Cholesterol, Total: 190 mg/dL (ref 100–199)
HDL: 87 mg/dL
LDL Chol Calc (NIH): 95 mg/dL (ref 0–99)
Triglycerides: 37 mg/dL (ref 0–149)
VLDL Cholesterol Cal: 8 mg/dL (ref 5–40)

## 2022-03-02 LAB — QUANTIFERON-TB GOLD PLUS
QuantiFERON Mitogen Value: >10 IU/mL
QuantiFERON Nil Value: 0.01 IU/mL
QuantiFERON TB1 Ag Value: 0.02 IU/mL
QuantiFERON TB2 Ag Value: 0.01 IU/mL
QuantiFERON-TB Gold Plus: NEGATIVE

## 2022-03-02 LAB — ANTI-MOG, SERUM: MOG Antibody, Cell-based IFA: POSITIVE — AB

## 2022-03-02 LAB — HEPATITIS B SURFACE ANTIGEN: Hepatitis B Surface Ag: NEGATIVE

## 2022-03-02 LAB — IGG, IGA, IGM
IgA/Immunoglobulin A, Serum: 223 mg/dL (ref 87–352)
IgG (Immunoglobin G), Serum: 1264 mg/dL (ref 586–1602)
IgM (Immunoglobulin M), Srm: 59 mg/dL (ref 26–217)

## 2022-03-02 LAB — HEPATITIS B CORE ANTIBODY, TOTAL: Hep B Core Total Ab: NEGATIVE

## 2022-03-02 LAB — HEPATITIS B SURFACE ANTIBODY,QUALITATIVE: Hep B Surface Ab, Qual: NONREACTIVE

## 2022-03-02 LAB — VARICELLA ZOSTER ANTIBODY, IGG: Varicella zoster IgG: 1961 index (ref >165)

## 2022-03-04 ENCOUNTER — Telehealth: Payer: Self-pay | Admitting: *Deleted

## 2022-03-04 NOTE — Telephone Encounter (Signed)
Called [name]. Relayed results per Dr. Bonnita Hollow note. [name] would like to proceed with IV rituxan. Aware we will submit orders to the infusion suite. They will work on Museum/gallery curator auth first. Once they have this, they will call to get her scheduled. She verbalized understanding.   Gave completed/signed orders to intrafusion for rituximab 1000mg  IV day 1 and day 15 then every 24 wk x55yr.  Premeds: Tylenol 650mg  po, benadryl 50mg  po

## 2022-03-04 NOTE — Telephone Encounter (Signed)
-----   Message from Asa Lente, MD sent at 03/03/2022  5:41 PM EST ----- Please let her know that the lab work looks good to begin either of the medications that we had talked about (Actemra or Rituxan)  I recommend that we start subcu Actemra.  If she has trouble tolerating that we could always switch to the IV Rituxan.

## 2022-03-23 ENCOUNTER — Ambulatory Visit
Admission: RE | Admit: 2022-03-23 | Discharge: 2022-03-23 | Disposition: A | Discharge status: Home / Self Care | Payer: 59 | Source: Ambulatory Visit | Attending: Obstetrics and Gynecology | Admitting: Obstetrics and Gynecology

## 2022-03-23 ENCOUNTER — Other Ambulatory Visit: Payer: Self-pay | Admitting: Obstetrics and Gynecology

## 2022-03-23 DIAGNOSIS — R928 Other abnormal and inconclusive findings on diagnostic imaging of breast: Secondary | ICD-10-CM

## 2022-03-23 DIAGNOSIS — N6489 Other specified disorders of breast: Secondary | ICD-10-CM

## 2022-05-04 ENCOUNTER — Ambulatory Visit: Payer: 59 | Admitting: Podiatry

## 2022-05-04 ENCOUNTER — Other Ambulatory Visit: Payer: Self-pay | Admitting: Podiatry

## 2022-05-04 ENCOUNTER — Ambulatory Visit (INDEPENDENT_AMBULATORY_CARE_PROVIDER_SITE_OTHER): Payer: 59

## 2022-05-04 DIAGNOSIS — Q666 Other congenital valgus deformities of feet: Secondary | ICD-10-CM | POA: Diagnosis not present

## 2022-05-04 DIAGNOSIS — M79672 Pain in left foot: Secondary | ICD-10-CM

## 2022-05-04 DIAGNOSIS — M722 Plantar fascial fibromatosis: Secondary | ICD-10-CM
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
## 2022-06-01 ENCOUNTER — Ambulatory Visit: Payer: 59 | Admitting: Podiatry

## 2022-06-01 DIAGNOSIS — M722 Plantar fascial fibromatosis: Secondary | ICD-10-CM | POA: Diagnosis not present

## 2022-06-01 DIAGNOSIS — M24572 Contracture, left ankle: Secondary | ICD-10-CM | POA: Diagnosis not present

## 2022-06-01 MED ORDER — METHYLPREDNISOLONE 4 MG PO TBPK: ORAL_TABLET | ORAL | 0 refills | Status: DC

## 2022-06-01 MED ORDER — MELOXICAM 15 MG PO TABS: 15.0000 mg | ORAL_TABLET | Freq: Every day | ORAL | 0 refills | Status: AC
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
## 2022-06-14 ENCOUNTER — Ambulatory Visit: Payer: 59 | Admitting: Neurology

## 2022-06-22 ENCOUNTER — Ambulatory Visit (INDEPENDENT_AMBULATORY_CARE_PROVIDER_SITE_OTHER): Payer: 59 | Admitting: Podiatry

## 2022-06-22 DIAGNOSIS — Q666 Other congenital valgus deformities of feet: Secondary | ICD-10-CM

## 2022-06-22 DIAGNOSIS — M722 Plantar fascial fibromatosis: Secondary | ICD-10-CM

## 2022-06-22 NOTE — Progress Notes (Signed)
Patient presents today to pick up custom molded foot orthotics recommended by Dr. PATEL.   Orthotics were dispensed and fit was satisfactory. Reviewed instructions for break-in and wear. Written instructions given to patient.  Patient will follow up as needed.   

## 2022-07-06 ENCOUNTER — Ambulatory Visit: Payer: 59 | Admitting: Podiatry

## 2022-07-06 DIAGNOSIS — M722 Plantar fascial fibromatosis: Secondary | ICD-10-CM
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
## 2022-08-04 ENCOUNTER — Ambulatory Visit: Payer: 59 | Admitting: Podiatry

## 2022-08-10 ENCOUNTER — Telehealth: Payer: Self-pay | Admitting: *Deleted

## 2022-08-10 ENCOUNTER — Other Ambulatory Visit: Payer: Self-pay | Admitting: Neurology

## 2022-08-10 ENCOUNTER — Other Ambulatory Visit: Payer: Self-pay | Admitting: *Deleted

## 2022-08-10 DIAGNOSIS — G3781 Myelin oligodendrocyte glycoprotein antibody disease: Secondary | ICD-10-CM

## 2022-08-10 DIAGNOSIS — Z79899 Other long term (current) drug therapy: Secondary | ICD-10-CM

## 2022-08-10 NOTE — Telephone Encounter (Signed)
LVM for pt letting her know she is due for lab work. Dr. Epimenio Foot placed orders. Provided her lab hours. Asked her to call back if any questions.

## 2022-08-12 ENCOUNTER — Other Ambulatory Visit (INDEPENDENT_AMBULATORY_CARE_PROVIDER_SITE_OTHER): Payer: Self-pay

## 2022-08-12 DIAGNOSIS — G3781 Myelin oligodendrocyte glycoprotein antibody disease: Secondary | ICD-10-CM

## 2022-08-12 DIAGNOSIS — Z79899 Other long term (current) drug therapy: Secondary | ICD-10-CM

## 2022-08-12 DIAGNOSIS — Z0289 Encounter for other administrative examinations: Secondary | ICD-10-CM

## 2022-08-14 LAB — CBC WITH DIFFERENTIAL/PLATELET
Basophils Absolute: 0 10*3/uL (ref 0.0–0.2)
Basos: 1 %
EOS (ABSOLUTE): 0.1 10*3/uL (ref 0.0–0.4)
Eos: 3 %
Hematocrit: 36.8 % (ref 34.0–46.6)
Hemoglobin: 12.2 g/dL (ref 11.1–15.9)
Immature Grans (Abs): 0 10*3/uL (ref 0.0–0.1)
Immature Granulocytes: 0 %
Lymphocytes Absolute: 0.8 10*3/uL (ref 0.7–3.1)
Lymphs: 24 %
MCH: 27.9 pg (ref 26.6–33.0)
MCHC: 33.2 g/dL (ref 31.5–35.7)
MCV: 84 fL (ref 79–97)
Monocytes Absolute: 0.4 10*3/uL (ref 0.1–0.9)
Monocytes: 11 %
Neutrophils Absolute: 2 10*3/uL (ref 1.4–7.0)
Neutrophils: 61 %
Platelets: 285 10*3/uL (ref 150–450)
RBC: 4.37 x10E6/uL (ref 3.77–5.28)
RDW: 13.2 % (ref 11.7–15.4)
WBC: 3.4 10*3/uL (ref 3.4–10.8)

## 2022-08-14 LAB — IGG, IGA, IGM
IgA/Immunoglobulin A, Serum: 242 mg/dL (ref 87–352)
IgG (Immunoglobin G), Serum: 1205 mg/dL (ref 586–1602)
IgM (Immunoglobulin M), Srm: 36 mg/dL (ref 26–217)

## 2022-08-14 LAB — ANTI-MOG, SERUM: MOG Antibody, Cell-based IFA: POSITIVE — AB

## 2022-08-14 LAB — ANTI-MOG ANTIBODY TITER, SERUM: Anti-MOG Antibody Titer, Serum: 1:1000 {titer}

## 2022-08-25 ENCOUNTER — Ambulatory Visit: Payer: 59 | Admitting: Podiatry

## 2022-09-15 ENCOUNTER — Ambulatory Visit: Payer: 59 | Admitting: Podiatry

## 2022-09-15 DIAGNOSIS — M722 Plantar fascial fibromatosis: Secondary | ICD-10-CM | POA: Diagnosis not present
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
## 2022-10-13 NOTE — Patient Instructions (Signed)
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
## 2022-10-14 ENCOUNTER — Encounter: Payer: Self-pay | Admitting: Family Medicine

## 2022-10-14 ENCOUNTER — Ambulatory Visit (INDEPENDENT_AMBULATORY_CARE_PROVIDER_SITE_OTHER): Payer: 59 | Admitting: Family Medicine

## 2022-10-14 VITALS — BP 115/78 | HR 62 | Ht 67.0 in | Wt 207.0 lb

## 2022-10-14 DIAGNOSIS — G3781 Myelin oligodendrocyte glycoprotein antibody disease: Secondary | ICD-10-CM | POA: Diagnosis not present

## 2022-10-14 DIAGNOSIS — G379 Demyelinating disease of central nervous system, unspecified: Secondary | ICD-10-CM

## 2022-10-14 DIAGNOSIS — R200 Anesthesia of skin: Secondary | ICD-10-CM

## 2022-10-14 DIAGNOSIS — Z79899 Other long term (current) drug therapy: Secondary | ICD-10-CM

## 2022-10-14 DIAGNOSIS — H469 Unspecified optic neuritis: Secondary | ICD-10-CM

## 2022-11-11 ENCOUNTER — Ambulatory Visit: Payer: 59 | Admitting: Sports Medicine

## 2022-12-16 ENCOUNTER — Ambulatory Visit: Payer: 59 | Admitting: Neurology

## 2023-01-28 ENCOUNTER — Ambulatory Visit: Payer: 59 | Admitting: Neurology

## 2023-01-28 ENCOUNTER — Encounter: Payer: Self-pay | Admitting: Neurology

## 2023-01-28 VITALS — BP 157/88 | HR 63 | Ht 67.0 in | Wt 198.5 lb

## 2023-01-28 DIAGNOSIS — Z79899 Other long term (current) drug therapy: Secondary | ICD-10-CM | POA: Diagnosis not present

## 2023-01-28 DIAGNOSIS — R260 Ataxic gait: Secondary | ICD-10-CM

## 2023-01-28 DIAGNOSIS — R200 Anesthesia of skin: Secondary | ICD-10-CM | POA: Diagnosis not present

## 2023-01-28 DIAGNOSIS — R899 Unspecified abnormal finding in specimens from other organs, systems and tissues: Secondary | ICD-10-CM

## 2023-01-28 DIAGNOSIS — G3781 Myelin oligodendrocyte glycoprotein antibody disease: Secondary | ICD-10-CM | POA: Diagnosis not present
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
## 2023-01-31 ENCOUNTER — Ambulatory Visit: Payer: 59 | Admitting: Neurology

## 2023-02-02 LAB — ANTI-MOG, SERUM: MOG Antibody, Cell-based IFA: POSITIVE — AB

## 2023-02-02 LAB — CD20 B CELLS
% CD19-B Cells: 0 % — ABNORMAL LOW (ref 4.6–22.1)
% CD20-B Cells: 0 % — ABNORMAL LOW (ref 5.0–22.3)

## 2023-02-02 LAB — IGG, IGA, IGM
IgA/Immunoglobulin A, Serum: 261 mg/dL (ref 87–352)
IgG (Immunoglobin G), Serum: 1349 mg/dL (ref 586–1602)
IgM (Immunoglobulin M), Srm: 29 mg/dL (ref 26–217)

## 2023-02-02 LAB — ANTI-MOG ANTIBODY TITER, SERUM: Anti-MOG Antibody Titer, Serum: 1:1000 {titer}

## 2023-04-06 ENCOUNTER — Other Ambulatory Visit: Payer: Self-pay | Admitting: *Deleted

## 2023-04-06 DIAGNOSIS — G3781 Myelin oligodendrocyte glycoprotein antibody disease: Secondary | ICD-10-CM

## 2023-04-06 DIAGNOSIS — Z79899 Other long term (current) drug therapy: Secondary | ICD-10-CM

## 2023-04-14 ENCOUNTER — Other Ambulatory Visit: Payer: Self-pay

## 2023-04-14 DIAGNOSIS — G3781 Myelin oligodendrocyte glycoprotein antibody disease: Secondary | ICD-10-CM

## 2023-04-14 DIAGNOSIS — Z79899 Other long term (current) drug therapy: Secondary | ICD-10-CM

## 2023-04-15 ENCOUNTER — Encounter: Payer: Self-pay | Admitting: Neurology

## 2023-04-15 LAB — CBC WITH DIFFERENTIAL/PLATELET
Basophils Absolute: 0 10*3/uL (ref 0.0–0.2)
Basos: 1 %
EOS (ABSOLUTE): 0.1 10*3/uL (ref 0.0–0.4)
Eos: 3 %
Hematocrit: 40.6 % (ref 34.0–46.6)
Hemoglobin: 13.2 g/dL (ref 11.1–15.9)
Immature Grans (Abs): 0 10*3/uL (ref 0.0–0.1)
Immature Granulocytes: 0 %
Lymphocytes Absolute: 1 10*3/uL (ref 0.7–3.1)
Lymphs: 29 %
MCH: 28.6 pg (ref 26.6–33.0)
MCHC: 32.5 g/dL (ref 31.5–35.7)
MCV: 88 fL (ref 79–97)
Monocytes Absolute: 0.5 10*3/uL (ref 0.1–0.9)
Monocytes: 14 %
Neutrophils Absolute: 1.8 10*3/uL (ref 1.4–7.0)
Neutrophils: 53 %
Platelets: 265 10*3/uL (ref 150–450)
RBC: 4.61 x10E6/uL (ref 3.77–5.28)
RDW: 12.9 % (ref 11.7–15.4)
WBC: 3.3 10*3/uL — ABNORMAL LOW (ref 3.4–10.8)

## 2023-05-12 ENCOUNTER — Ambulatory Visit: Payer: 59 | Admitting: Physical Therapy

## 2023-09-01 ENCOUNTER — Ambulatory Visit: Payer: 59 | Admitting: Neurology

## 2023-09-01 ENCOUNTER — Encounter: Payer: Self-pay | Admitting: Neurology

## 2023-09-01 VITALS — BP 142/93 | HR 65 | Ht 67.0 in | Wt 194.0 lb

## 2023-09-01 DIAGNOSIS — R260 Ataxic gait: Secondary | ICD-10-CM | POA: Diagnosis not present

## 2023-09-01 DIAGNOSIS — G3781 Myelin oligodendrocyte glycoprotein antibody disease: Secondary | ICD-10-CM

## 2023-09-01 DIAGNOSIS — Z79899 Other long term (current) drug therapy: Secondary | ICD-10-CM | POA: Diagnosis not present
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
## 2023-09-21 ENCOUNTER — Ambulatory Visit

## 2023-09-21 ENCOUNTER — Ambulatory Visit: Payer: Self-pay | Admitting: Neurology

## 2023-09-21 ENCOUNTER — Ambulatory Visit: Admitting: Medical

## 2023-09-21 VITALS — BP 124/80 | HR 73 | Wt 199.0 lb

## 2023-09-21 DIAGNOSIS — R5383 Other fatigue: Secondary | ICD-10-CM

## 2023-09-21 DIAGNOSIS — R202 Paresthesia of skin: Secondary | ICD-10-CM

## 2023-09-21 DIAGNOSIS — G3781 Myelin oligodendrocyte glycoprotein antibody disease: Secondary | ICD-10-CM

## 2023-09-21 DIAGNOSIS — R9431 Abnormal electrocardiogram [ECG] [EKG]: Secondary | ICD-10-CM | POA: Diagnosis not present

## 2023-09-21 DIAGNOSIS — L659 Nonscarring hair loss, unspecified: Secondary | ICD-10-CM | POA: Diagnosis not present

## 2023-09-21 DIAGNOSIS — R260 Ataxic gait: Secondary | ICD-10-CM

## 2023-09-21 MED ORDER — GADOBENATE DIMEGLUMINE 529 MG/ML IV SOLN
20.0000 mL | Freq: Once | INTRAVENOUS | Status: AC | PRN
  Administered 2023-09-21: 20 mL via INTRAVENOUS
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
## 2023-09-22 ENCOUNTER — Ambulatory Visit: Payer: Self-pay | Admitting: Medical

## 2023-09-22 ENCOUNTER — Other Ambulatory Visit: Payer: Self-pay | Admitting: Medical

## 2023-09-22 DIAGNOSIS — R9431 Abnormal electrocardiogram [ECG] [EKG]: Secondary | ICD-10-CM

## 2023-09-22 LAB — COMPREHENSIVE METABOLIC PANEL WITH GFR
ALT: 8 IU/L (ref 0–32)
AST: 17 IU/L (ref 0–40)
Albumin: 4.2 g/dL (ref 3.9–4.9)
Alkaline Phosphatase: 46 IU/L (ref 44–121)
BUN/Creatinine Ratio: 22 (ref 9–23)
BUN: 18 mg/dL (ref 6–24)
Bilirubin Total: 0.2 mg/dL (ref 0.0–1.2)
CO2: 20 mmol/L (ref 20–29)
Calcium: 9.1 mg/dL (ref 8.7–10.2)
Chloride: 103 mmol/L (ref 96–106)
Creatinine, Ser: 0.83 mg/dL (ref 0.57–1.00)
Globulin, Total: 2.5 g/dL (ref 1.5–4.5)
Glucose: 86 mg/dL (ref 70–99)
Potassium: 4.3 mmol/L (ref 3.5–5.2)
Sodium: 137 mmol/L (ref 134–144)
Total Protein: 6.7 g/dL (ref 6.0–8.5)
eGFR: 90 mL/min/1.73 (ref >59)

## 2023-09-22 LAB — CBC
Hematocrit: 39.4 % (ref 34.0–46.6)
Hemoglobin: 12.9 g/dL (ref 11.1–15.9)
MCH: 29.3 pg (ref 26.6–33.0)
MCHC: 32.7 g/dL (ref 31.5–35.7)
MCV: 90 fL (ref 79–97)
Platelets: 331 x10E3/uL (ref 150–450)
RBC: 4.4 x10E6/uL (ref 3.77–5.28)
RDW: 12 % (ref 11.7–15.4)
WBC: 6.2 x10E3/uL (ref 3.4–10.8)

## 2023-09-22 LAB — SEDIMENTATION RATE: Sed Rate: 37 mm/h — AB (ref 0–32)

## 2023-09-22 LAB — IRON: Iron: 81 ug/dL (ref 27–159)

## 2023-09-22 LAB — ANA

## 2023-09-22 LAB — VITAMIN D 25 HYDROXY (VIT D DEFICIENCY, FRACTURES): Vit D, 25-Hydroxy: 12.7 ng/mL — AB (ref 30.0–100.0)

## 2023-09-22 LAB — TSH+FREE T4
Free T4: 1.04 ng/dL (ref 0.82–1.77)
TSH: 2.33 u[IU]/mL (ref 0.450–4.500)

## 2023-09-22 LAB — VITAMIN B12: Vitamin B-12: 797 pg/mL (ref 232–1245)

## 2023-09-22 MED ORDER — VITAMIN D (ERGOCALCIFEROL) 1.25 MG (50000 UNIT) PO CAPS: 50000.0000 [IU] | ORAL_CAPSULE | ORAL | 3 refills | Status: AC

## 2023-09-22 NOTE — Progress Notes (Signed)
Results sent through mychart 

## 2023-09-22 NOTE — Progress Notes (Signed)
 Results via [employer]

## 2023-11-21 ENCOUNTER — Encounter: Admitting: Medical

## 2023-11-28 ENCOUNTER — Other Ambulatory Visit: Payer: Self-pay | Admitting: Obstetrics and Gynecology

## 2023-11-28 DIAGNOSIS — N6489 Other specified disorders of breast: Secondary | ICD-10-CM

## 2023-11-28 DIAGNOSIS — Z1231 Encounter for screening mammogram for malignant neoplasm of breast: Secondary | ICD-10-CM

## 2023-12-07 ENCOUNTER — Encounter

## 2023-12-19 ENCOUNTER — Encounter

## 2024-01-16 NOTE — Progress Notes (Deleted)
   Cardiology Office Note    Date:  01/16/2024  ID:  [name], DOB 1980/04/24, MRN [id number] PCP:  Bulah Alm RAMAN, PA-C  Cardiologist:  None - New  Chief Complaint: ***  History of Present Illness: .    [name] is a 43 y.o. female with visit-pertinent history of myelin oligodendrocyte glycoprotein antibody disorder treated with Rituxan, depression, eczema seen for evaluation of abnormal EKG at the request of Alm Bulah PA-C. She saw her primary care provider in 09/2023 for generalized fatigue, hair loss, fatigue, weight gain, sometimes hands feel swollen or jittery. EKG 09/21/23 showed nonspecific STTW changes with generally low voltage - there was TWI in lead III, nonspecific ST coving V3, subtle ST upsloping in I, II, avF, V4-V6. She was referred to cardiology. Vit D was also noted to be low so started on supplementation.  Repeat EKG here since last one was 4 months ago***  Abnormal EKG Fatigue  Family History: Tobacco: Alcohol: Drug use:  Labwork independently reviewed: 09/2023 ESR 37, ANA OK, Vit D low, TSH/FT4 wnl, CMET OK K 4.3, CBC wnl  ROS: .    Please see the history of present illness. Otherwise, review of systems is positive for ***.  All other systems are reviewed and otherwise negative.  Studies Reviewed: SABRA    EKG:  EKG is ordered today, personally reviewed, demonstrating ***  09/21/2023 EKG showed nonspecific STTW changes with generally low voltage - there was TWI in lead III, nonspecific ST coving V3, subtle ST upsloping in I, II, avF, V4-V6.   CV Studies: Cardiac studies reviewed are outlined and summarized above. Otherwise please see EMR for full report.   Current Reported Medications:.    No outpatient medications have been marked as taking for the 01/17/24 encounter (Appointment) with [place] Alfredo N, PA-C.    Physical Exam:    VS:  There were no vitals taken for this visit.   Wt Readings from Last 3 Encounters:  09/21/23 199 lb (90.3 kg)   09/01/23 194 lb (88 kg)  01/28/23 198 lb 8 oz (90 kg)    GEN: Well nourished, well developed in no acute distress NECK: No JVD; No carotid bruits CARDIAC: ***RRR, no murmurs, rubs, gallops RESPIRATORY:  Clear to auscultation without rales, wheezing or rhonchi  ABDOMEN: Soft, non-tender, non-distended EXTREMITIES:  No edema; No acute deformity   Asessement and Plan:.     ***     Disposition: F/u with ***  ***[id number]***  Signed, Santosha Jividen N Tacuma Graffam, PA-C

## 2024-01-17 ENCOUNTER — Ambulatory Visit: Attending: Physician Assistant | Admitting: Physician Assistant

## 2024-01-17 DIAGNOSIS — R9431 Abnormal electrocardiogram [ECG] [EKG]: Secondary | ICD-10-CM

## 2024-01-17 DIAGNOSIS — R5383 Other fatigue: Secondary | ICD-10-CM

## 2024-01-30 NOTE — Progress Notes (Deleted)
    OFFICE NOTE:    Date:  01/30/2024  ID:  [name], DOB 07/16/1980, MRN [id number] PCP: [name]  [facility] Providers Cardiologist:  None { Click to update primary MD,subspecialty MD or APP then REFRESH:1}       Obesity s/p Lap Band Myelin oligodendrocyte glycoprotein antibody disorder         Discussed the use of AI scribe software for clinical note transcription with the patient, who gave verbal consent to proceed.  History of Present Illness [name] is a 43 y.o. female referred by Krystal Alm GORMAN, PA-C for abnormal EKG. Pt was seen in 09/2023 for fatigue. EKG was checked and felt to be abnormal.     ROS-See HPI***    Studies Reviewed:      09/21/23: TSH 2.33, Hgb 12.9, SCr 0.83, K 4.3, ALT 8  EKG 09/21/23: NSR, HR 68, Normal axis, non-specific ST-TW changes, QTc 429 Results  Risk Assessment/Calculations: {Does this patient have ATRIAL FIBRILLATION?[phone]} No BP recorded.  {Refresh Note OR Click here to enter BP  :1}***      Physical Exam:  VS:  There were no vitals taken for this visit.       Wt Readings from Last 3 Encounters:  09/21/23 199 lb (90.3 kg)  09/01/23 194 lb (88 kg)  01/28/23 198 lb 8 oz (90 kg)    Physical Exam***     Assessment and Plan:    Assessment & Plan Nonspecific abnormal electrocardiogram (ECG) (EKG)  Assessment and Plan Assessment & Plan    {      :1}    {Are you ordering a CV Procedure (e.g. stress test, cath, DCCV, TEE, etc)?   Press F2        :[id number]}  Dispo:  No follow-ups on file.  Signed, Glendia Ferrier, PA-C

## 2024-01-31 ENCOUNTER — Ambulatory Visit: Attending: Physician Assistant | Admitting: Physician Assistant

## 2024-01-31 DIAGNOSIS — R9431 Abnormal electrocardiogram [ECG] [EKG]: Secondary | ICD-10-CM

## 2024-03-20 ENCOUNTER — Inpatient Hospital Stay: Admission: RE | Admit: 2024-03-20 | Discharge: 2024-03-20 | Attending: Obstetrics and Gynecology

## 2024-03-20 DIAGNOSIS — N6489 Other specified disorders of breast: Secondary | ICD-10-CM

## 2024-03-27 ENCOUNTER — Telehealth: Payer: Self-pay | Admitting: Neurology

## 2024-03-27 NOTE — Telephone Encounter (Signed)
 Pt called to  reschedule appt  due to Pt child has emergency surgery .  Pt wanted to know  if she has to reschedule  Infusion appt as well

## 2024-03-27 NOTE — Telephone Encounter (Signed)
 Pt will reach ou to Infusions

## 2024-03-27 NOTE — Telephone Encounter (Signed)
 Pt will reach out to referral

## 2024-03-28 ENCOUNTER — Other Ambulatory Visit: Payer: Self-pay

## 2024-03-28 ENCOUNTER — Ambulatory Visit: Admitting: Neurology

## 2024-03-28 DIAGNOSIS — R200 Anesthesia of skin: Secondary | ICD-10-CM

## 2024-03-28 DIAGNOSIS — G379 Demyelinating disease of central nervous system, unspecified: Secondary | ICD-10-CM

## 2024-03-28 DIAGNOSIS — G3781 Myelin oligodendrocyte glycoprotein antibody disease: Secondary | ICD-10-CM

## 2024-03-28 DIAGNOSIS — R260 Ataxic gait: Secondary | ICD-10-CM

## 2024-03-28 DIAGNOSIS — H469 Unspecified optic neuritis: Secondary | ICD-10-CM

## 2024-03-28 DIAGNOSIS — R899 Unspecified abnormal finding in specimens from other organs, systems and tissues: Secondary | ICD-10-CM

## 2024-04-12 ENCOUNTER — Other Ambulatory Visit: Payer: Self-pay

## 2024-04-12 DIAGNOSIS — Z79899 Other long term (current) drug therapy: Secondary | ICD-10-CM

## 2024-04-12 DIAGNOSIS — G3781 Myelin oligodendrocyte glycoprotein antibody disease: Secondary | ICD-10-CM

## 2024-04-12 DIAGNOSIS — G379 Demyelinating disease of central nervous system, unspecified: Secondary | ICD-10-CM

## 2024-04-12 DIAGNOSIS — H469 Unspecified optic neuritis: Secondary | ICD-10-CM

## 2024-04-12 DIAGNOSIS — R260 Ataxic gait: Secondary | ICD-10-CM

## 2024-04-12 DIAGNOSIS — R899 Unspecified abnormal finding in specimens from other organs, systems and tissues: Secondary | ICD-10-CM

## 2024-04-12 DIAGNOSIS — R200 Anesthesia of skin: Secondary | ICD-10-CM

## 2024-04-13 ENCOUNTER — Ambulatory Visit: Payer: Self-pay | Admitting: Neurology

## 2024-04-13 LAB — CBC WITH DIFFERENTIAL/PLATELET
Basophils Absolute: 0 10*3/uL (ref 0.0–0.2)
Basos: 1 %
EOS (ABSOLUTE): 0.1 10*3/uL (ref 0.0–0.4)
Eos: 2 %
Hematocrit: 41.9 % (ref 34.0–46.6)
Hemoglobin: 13.6 g/dL (ref 11.1–15.9)
Immature Grans (Abs): 0 10*3/uL (ref 0.0–0.1)
Immature Granulocytes: 0 %
Lymphocytes Absolute: 0.9 10*3/uL (ref 0.7–3.1)
Lymphs: 21 %
MCH: 30.4 pg (ref 26.6–33.0)
MCHC: 32.5 g/dL (ref 31.5–35.7)
MCV: 94 fL (ref 79–97)
Monocytes Absolute: 0.3 10*3/uL (ref 0.1–0.9)
Monocytes: 7 %
Neutrophils Absolute: 2.9 10*3/uL (ref 1.4–7.0)
Neutrophils: 69 %
Platelets: 304 10*3/uL (ref 150–450)
RBC: 4.47 x10E6/uL (ref 3.77–5.28)
RDW: 12.7 % (ref 11.7–15.4)
WBC: 4.2 10*3/uL (ref 3.4–10.8)

## 2024-04-13 LAB — HEPATITIS B SURFACE ANTIGEN: Hepatitis B Surface Ag: NEGATIVE

## 2024-11-06 ENCOUNTER — Ambulatory Visit: Admitting: Neurology
